# Patient Record
Sex: Female | Born: 1945 | Race: White | Hispanic: No | Marital: Married | Smoking: Current every day smoker
Health system: Southern US, Community
[De-identification: ages and names within clinical notes are randomized; demographics above are authoritative.]

## PROBLEM LIST (undated history)

## (undated) DIAGNOSIS — M810 Age-related osteoporosis without current pathological fracture: Secondary | ICD-10-CM

## (undated) DIAGNOSIS — K5790 Diverticulosis of intestine, part unspecified, without perforation or abscess without bleeding: Secondary | ICD-10-CM

## (undated) DIAGNOSIS — K5090 Crohn's disease, unspecified, without complications: Secondary | ICD-10-CM

## (undated) DIAGNOSIS — K219 Gastro-esophageal reflux disease without esophagitis: Secondary | ICD-10-CM

## (undated) DIAGNOSIS — I10 Essential (primary) hypertension: Secondary | ICD-10-CM

## (undated) DIAGNOSIS — E785 Hyperlipidemia, unspecified: Secondary | ICD-10-CM

## (undated) HISTORY — PX: NASAL SINUS SURGERY: SHX719

## (undated) HISTORY — PX: REPLACEMENT TOTAL KNEE: SUR1224

## (undated) HISTORY — PX: HAND SURGERY: SHX662

---

## 2009-10-03 ENCOUNTER — Emergency Department (HOSPITAL_COMMUNITY): Admission: EM | Admit: 2009-10-03 | Discharge: 2009-10-03 | Payer: Self-pay | Admitting: Emergency Medicine

## 2015-10-29 ENCOUNTER — Emergency Department (HOSPITAL_BASED_OUTPATIENT_CLINIC_OR_DEPARTMENT_OTHER)
Admission: EM | Admit: 2015-10-29 | Discharge: 2015-10-29 | Disposition: A | Discharge status: Home / Self Care | Payer: Medicare HMO | Attending: Emergency Medicine | Admitting: Emergency Medicine

## 2015-10-29 ENCOUNTER — Encounter (HOSPITAL_BASED_OUTPATIENT_CLINIC_OR_DEPARTMENT_OTHER): Payer: Self-pay | Admitting: *Deleted

## 2015-10-29 ENCOUNTER — Emergency Department (HOSPITAL_BASED_OUTPATIENT_CLINIC_OR_DEPARTMENT_OTHER): Payer: Medicare HMO

## 2015-10-29 DIAGNOSIS — I10 Essential (primary) hypertension: Secondary | ICD-10-CM | POA: Diagnosis not present

## 2015-10-29 DIAGNOSIS — E785 Hyperlipidemia, unspecified: Secondary | ICD-10-CM | POA: Diagnosis not present

## 2015-10-29 DIAGNOSIS — J40 Bronchitis, not specified as acute or chronic: Secondary | ICD-10-CM | POA: Diagnosis not present

## 2015-10-29 DIAGNOSIS — R112 Nausea with vomiting, unspecified: Secondary | ICD-10-CM | POA: Diagnosis present

## 2015-10-29 DIAGNOSIS — Z79899 Other long term (current) drug therapy: Secondary | ICD-10-CM | POA: Diagnosis not present

## 2015-10-29 DIAGNOSIS — F17200 Nicotine dependence, unspecified, uncomplicated: Secondary | ICD-10-CM | POA: Diagnosis not present

## 2015-10-29 DIAGNOSIS — R110 Nausea: Secondary | ICD-10-CM

## 2015-10-29 HISTORY — DX: Hyperlipidemia, unspecified: E78.5

## 2015-10-29 HISTORY — DX: Age-related osteoporosis without current pathological fracture: M81.0

## 2015-10-29 HISTORY — DX: Essential (primary) hypertension: I10

## 2015-10-29 HISTORY — DX: Diverticulosis of intestine, part unspecified, without perforation or abscess without bleeding: K57.90

## 2015-10-29 HISTORY — DX: Gastro-esophageal reflux disease without esophagitis: K21.9

## 2015-10-29 LAB — URINE MICROSCOPIC-ADD ON

## 2015-10-29 LAB — CBC WITH DIFFERENTIAL/PLATELET
Basophils Absolute: 0 10*3/uL (ref 0.0–0.1)
Basophils Relative: 0 %
Eosinophils Absolute: 0 10*3/uL (ref 0.0–0.7)
Eosinophils Relative: 0 %
HEMATOCRIT: 39 % (ref 36.0–46.0)
HEMOGLOBIN: 13.1 g/dL (ref 12.0–15.0)
LYMPHS ABS: 0.8 10*3/uL (ref 0.7–4.0)
LYMPHS PCT: 14 %
MCH: 31.9 pg (ref 26.0–34.0)
MCHC: 33.6 g/dL (ref 30.0–36.0)
MCV: 94.9 fL (ref 78.0–100.0)
MONOS PCT: 8 %
Monocytes Absolute: 0.5 10*3/uL (ref 0.1–1.0)
NEUTROS ABS: 4.6 10*3/uL (ref 1.7–7.7)
NEUTROS PCT: 78 %
Platelets: 198 10*3/uL (ref 150–400)
RBC: 4.11 MIL/uL (ref 3.87–5.11)
RDW: 13.7 % (ref 11.5–15.5)
WBC: 5.9 10*3/uL (ref 4.0–10.5)

## 2015-10-29 LAB — COMPREHENSIVE METABOLIC PANEL
ALT: 16 U/L (ref 14–54)
AST: 20 U/L (ref 15–41)
Albumin: 3.7 g/dL (ref 3.5–5.0)
Alkaline Phosphatase: 62 U/L (ref 38–126)
Anion gap: 5 (ref 5–15)
BILIRUBIN TOTAL: 1.2 mg/dL (ref 0.3–1.2)
BUN: 11 mg/dL (ref 6–20)
CALCIUM: 9.6 mg/dL (ref 8.9–10.3)
CO2: 26 mmol/L (ref 22–32)
Chloride: 104 mmol/L (ref 101–111)
Creatinine, Ser: 0.86 mg/dL (ref 0.44–1.00)
Glucose, Bld: 114 mg/dL — ABNORMAL HIGH (ref 65–99)
POTASSIUM: 3.5 mmol/L (ref 3.5–5.1)
Sodium: 135 mmol/L (ref 135–145)
Total Protein: 6.6 g/dL (ref 6.5–8.1)

## 2015-10-29 LAB — TROPONIN I: Troponin I: <0.03 ng/mL (ref <0.031)

## 2015-10-29 LAB — URINALYSIS, ROUTINE W REFLEX MICROSCOPIC
BILIRUBIN URINE: NEGATIVE
GLUCOSE, UA: NEGATIVE mg/dL
Ketones, ur: NEGATIVE mg/dL
Nitrite: NEGATIVE
PH: 6.5 (ref 5.0–8.0)
Protein, ur: NEGATIVE mg/dL
SPECIFIC GRAVITY, URINE: 1.008 (ref 1.005–1.030)

## 2015-10-29 MED ORDER — AZITHROMYCIN 250 MG PO TABS: 250.0000 mg | ORAL_TABLET | Freq: Every day | ORAL | Status: DC

## 2015-10-29 MED ORDER — SODIUM CHLORIDE 0.9 % IV BOLUS (SEPSIS)
500.0000 mL | Freq: Once | INTRAVENOUS | Status: AC
  Administered 2015-10-29: 500 mL via INTRAVENOUS

## 2015-10-29 MED ORDER — ONDANSETRON HCL 4 MG PO TABS: 4.0000 mg | ORAL_TABLET | Freq: Four times a day (QID) | ORAL | Status: DC

## 2015-10-29 MED ORDER — ONDANSETRON HCL 4 MG/2ML IJ SOLN
4.0000 mg | Freq: Once | INTRAMUSCULAR | Status: AC
  Administered 2015-10-29: 4 mg via INTRAVENOUS
  Filled 2015-10-29: qty 2

## 2015-10-29 NOTE — ED Notes (Signed)
Patient states that she recently took antibiotics for a sinus infection. She states she had chills last night, approx an hour ago she vomited several times, fatigue. Glucose was 123 ont the ambulance. 124/60

## 2015-10-29 NOTE — ED Notes (Addendum)
Given soda to drink and crackers.

## 2015-10-29 NOTE — Discharge Instructions (Signed)
Nausea and Vomiting °Nausea is a sick feeling that often comes before throwing up (vomiting). Vomiting is a reflex where stomach contents come out of your mouth. Vomiting can cause severe loss of body fluids (dehydration). Children and elderly adults can become dehydrated quickly, especially if they also have diarrhea. Nausea and vomiting are symptoms of a condition or disease. It is important to find the cause of your symptoms. °CAUSES  °· Direct irritation of the stomach lining. This irritation can result from increased acid production (gastroesophageal reflux disease), infection, food poisoning, taking certain medicines (such as nonsteroidal anti-inflammatory drugs), alcohol use, or tobacco use. °· Signals from the brain. These signals could be caused by a headache, heat exposure, an inner ear disturbance, increased pressure in the brain from injury, infection, a tumor, or a concussion, pain, emotional stimulus, or metabolic problems. °· An obstruction in the gastrointestinal tract (bowel obstruction). °· Illnesses such as diabetes, hepatitis, gallbladder problems, appendicitis, kidney problems, cancer, sepsis, atypical symptoms of a heart attack, or eating disorders. °· Medical treatments such as chemotherapy and radiation. °· Receiving medicine that makes you sleep (general anesthetic) during surgery. °DIAGNOSIS °Your caregiver may ask for tests to be done if the problems do not improve after a few days. Tests may also be done if symptoms are severe or if the reason for the nausea and vomiting is not clear. Tests may include: °· Urine tests. °· Blood tests. °· Stool tests. °· Cultures (to look for evidence of infection). °· X-rays or other imaging studies. °Test results can help your caregiver make decisions about treatment or the need for additional tests. °TREATMENT °You need to stay well hydrated. Drink frequently but in small amounts. You may wish to drink water, sports drinks, clear broth, or eat frozen  ice pops or gelatin dessert to help stay hydrated. When you eat, eating slowly may help prevent nausea. There are also some antinausea medicines that may help prevent nausea. °HOME CARE INSTRUCTIONS  °· Take all medicine as directed by your caregiver. °· If you do not have an appetite, do not force yourself to eat. However, you must continue to drink fluids. °· If you have an appetite, eat a normal diet unless your caregiver tells you differently. °¨ Eat a variety of complex carbohydrates (rice, wheat, potatoes, bread), lean meats, yogurt, fruits, and vegetables. °¨ Avoid high-fat foods because they are more difficult to digest. °· Drink enough water and fluids to keep your urine clear or pale yellow. °· If you are dehydrated, ask your caregiver for specific rehydration instructions. Signs of dehydration may include: °¨ Severe thirst. °¨ Dry lips and mouth. °¨ Dizziness. °¨ Dark urine. °¨ Decreasing urine frequency and amount. °¨ Confusion. °¨ Rapid breathing or pulse. °SEEK IMMEDIATE MEDICAL CARE IF:  °· You have blood or brown flecks (like coffee grounds) in your vomit. °· You have black or bloody stools. °· You have a severe headache or stiff neck. °· You are confused. °· You have severe abdominal pain. °· You have chest pain or trouble breathing. °· You do not urinate at least once every 8 hours. °· You develop cold or clammy skin. °· You continue to vomit for longer than 24 to 48 hours. °· You have a fever. °MAKE SURE YOU:  °· Understand these instructions. °· Will watch your condition. °· Will get help right away if you are not doing well or get worse. °  °This information is not intended to replace advice given to you by your health care provider. Make sure   you discuss any questions you have with your health care provider. °  °Document Released: 06/18/2005 Document Revised: 09/10/2011 Document Reviewed: 11/15/2010 °Elsevier Interactive Patient Education ©2016 [employer] ° °Upper Respiratory Infection,  Adult °Most upper respiratory infections (URIs) are a viral infection of the air passages leading to the lungs. A URI affects the nose, throat, and upper air passages. The most common type of URI is nasopharyngitis and is typically referred to as "the common cold." °URIs run their course and usually go away on their own. Most of the time, a URI does not require medical attention, but sometimes a bacterial infection in the upper airways can follow a viral infection. This is called a secondary infection. Sinus and middle ear infections are common types of secondary upper respiratory infections. °Bacterial pneumonia can also complicate a URI. A URI can worsen asthma and chronic obstructive pulmonary disease (COPD). Sometimes, these complications can require emergency medical care and may be life threatening.  °CAUSES °Almost all URIs are caused by viruses. A virus is a type of germ and can spread from one person to another.  °RISKS FACTORS °You may be at risk for a URI if:  °· You smoke.   °· You have chronic heart or lung disease. °· You have a weakened defense (immune) system.   °· You are very young or very old.   °· You have nasal allergies or asthma. °· You work in crowded or poorly ventilated areas. °· You work in health care facilities or schools. °SIGNS AND SYMPTOMS  °Symptoms typically develop 2-3 days after you come in contact with a cold virus. Most viral URIs last 7-10 days. However, viral URIs from the influenza virus (flu virus) can last 14-18 days and are typically more severe. Symptoms may include:  °· Runny or stuffy (congested) nose.   °· Sneezing.   °· Cough.   °· Sore throat.   °· Headache.   °· Fatigue.   °· Fever.   °· Loss of appetite.   °· Pain in your forehead, behind your eyes, and over your cheekbones (sinus pain). °· Muscle aches.   °DIAGNOSIS  °Your health care provider may diagnose a URI by: °· Physical exam. °· Tests to check that your symptoms are not due to another condition such  as: °¨ Strep throat. °¨ Sinusitis. °¨ Pneumonia. °¨ Asthma. °TREATMENT  °A URI goes away on its own with time. It cannot be cured with medicines, but medicines may be prescribed or recommended to relieve symptoms. Medicines may help: °· Reduce your fever. °· Reduce your cough. °· Relieve nasal congestion. °HOME CARE INSTRUCTIONS  °· Take medicines only as directed by your health care provider.   °· Gargle warm saltwater or take cough drops to comfort your throat as directed by your health care provider. °· Use a warm mist humidifier or inhale steam from a shower to increase air moisture. This may make it easier to breathe. °· Drink enough fluid to keep your urine clear or pale yellow.   °· Eat soups and other clear broths and maintain good nutrition.   °· Rest as needed.   °· Return to work when your temperature has returned to normal or as your health care provider advises. You may need to stay home longer to avoid infecting others. You can also use a face mask and careful hand washing to prevent spread of the virus. °· Increase the usage of your inhaler if you have asthma.   °· Do not use any tobacco products, including cigarettes, chewing tobacco, or electronic cigarettes. If you need help quitting, ask your   health care provider. °PREVENTION  °The best way to protect yourself from getting a cold is to practice good hygiene.  °· Avoid oral or hand contact with people with cold symptoms.   °· Wash your hands often if contact occurs.   °There is no clear evidence that vitamin C, vitamin E, echinacea, or exercise reduces the chance of developing a cold. However, it is always recommended to get plenty of rest, exercise, and practice good nutrition.  °SEEK MEDICAL CARE IF:  °· You are getting worse rather than better.   °· Your symptoms are not controlled by medicine.   °· You have chills. °· You have worsening shortness of breath. °· You have brown or red mucus. °· You have yellow or brown nasal discharge. °· You have  pain in your face, especially when you bend forward. °· You have a fever. °· You have swollen neck glands. °· You have pain while swallowing. °· You have white areas in the back of your throat. °SEEK IMMEDIATE MEDICAL CARE IF:  °· You have severe or persistent: °¨ Headache. °¨ Ear pain. °¨ Sinus pain. °¨ Chest pain. °· You have chronic lung disease and any of the following: °¨ Wheezing. °¨ Prolonged cough. °¨ Coughing up blood. °¨ A change in your usual mucus. °· You have a stiff neck. °· You have changes in your: °¨ Vision. °¨ Hearing. °¨ Thinking. °¨ Mood. °MAKE SURE YOU:  °· Understand these instructions. °· Will watch your condition. °· Will get help right away if you are not doing well or get worse. °  °This information is not intended to replace advice given to you by your health care provider. Make sure you discuss any questions you have with your health care provider. °  °Document Released: 12/12/2000 Document Revised: 11/02/2014 Document Reviewed: 09/23/2013 °Elsevier Interactive Patient Education ©2016 [employer] ° °

## 2015-10-29 NOTE — ED Provider Notes (Signed)
CSN: [id number]     Arrival date & time 10/29/15  1850 History  By signing my name below, I, Bethel Born, attest that this documentation has been prepared under the direction and in the presence of Tilden Fossa, MD. Electronically Signed: Bethel Born, ED Scribe. 10/29/2015. 7:41 PM    Chief Complaint  Patient presents with  . Nausea   The history is provided by the patient. No language interpreter was used.   Sheila Davenport is a 70 y.o. female who presents to the Emergency Department complaining of nausea and vomiting with onset this afternoon. She had 1 episode of emesis "that was in several stages". Pt states that she has had a sinus infection since last month and was put on cefdinir without relief. Associated symptoms include yellow nasal discharge and a cough productive of yellow sputum. Last night she had an episode of chills and was shaking uncontrollably. Her temperature was 100.7 at that time and she treated it with Tylenol. Since than she has had fatigue.  Pt denies chest pain, abdominal pain, diarrhea, dysuria. Pt notes being under significant stress since her daughter dies of breast cancer 25 days ago.   Past Medical History  Diagnosis Date  . Hypertension   . Diverticulosis   . Osteoporosis   . GERD (gastroesophageal reflux disease)   . Dyslipidemia    Past Surgical History  Procedure Laterality Date  . Replacement total knee Right   . Nasal sinus surgery    . Hand surgery Right    No family history on file. Social History  Substance Use Topics  . Smoking status: Current Every Day Smoker  . Smokeless tobacco: None  . Alcohol Use: No   OB History    No data available     Review of Systems  Constitutional: Positive for fever, chills and fatigue.  HENT: Positive for congestion and rhinorrhea.   Respiratory: Positive for cough.   Cardiovascular: Negative for chest pain.  Gastrointestinal: Positive for nausea and vomiting. Negative for abdominal pain and  diarrhea.  Genitourinary: Negative for dysuria.  All other systems reviewed and are negative.  Allergies  Decongest-aid; Levofloxacin; Nitrofuran derivatives; and Phenazopyridine  Home Medications   Prior to Admission medications   Medication Sig Start Date End Date Taking? Authorizing Provider  aspirin 81 MG tablet Take 81 mg by mouth daily.   Yes Historical Provider, MD  atenolol (TENORMIN) 50 MG tablet Take 50 mg by mouth daily.   Yes Historical Provider, MD  BIOGAIA PROBIOTIC (BIOGAIA PROBIOTIC) LIQD Take by mouth daily at 8 pm.   Yes Historical Provider, MD  calcium-vitamin D (OSCAL WITH D) 500-200 MG-UNIT tablet Take 1 tablet by mouth.   Yes Historical Provider, MD  cholecalciferol (VITAMIN D) 400 units TABS tablet Take 800 Units by mouth.   Yes Historical Provider, MD  dicyclomine (BENTYL) 20 MG tablet Take 20 mg by mouth every 6 (six) hours.   Yes Historical Provider, MD  meclizine (ANTIVERT) 25 MG tablet Take 25 mg by mouth 3 (three) times daily as needed for dizziness.   Yes Historical Provider, MD  Multiple Vitamins-Minerals (MULTIVITAMIN WITH MINERALS) tablet Take 1 tablet by mouth daily.   Yes Historical Provider, MD  sertraline (ZOLOFT) 50 MG tablet Take 50 mg by mouth daily.   Yes Historical Provider, MD  valsartan-hydrochlorothiazide (DIOVAN-HCT) 160-12.5 MG tablet Take 1 tablet by mouth daily.   Yes Historical Provider, MD  azithromycin (ZITHROMAX) 250 MG tablet Take 1 tablet (250 mg total) by mouth daily.  Take first 2 tablets together, then 1 every day until finished. 10/29/15   Tilden Fossa, MD  ondansetron (ZOFRAN) 4 MG tablet Take 1 tablet (4 mg total) by mouth every 6 (six) hours. 10/29/15   Tilden Fossa, MD   BP 121/59 mmHg  Pulse 67  Temp(Src) 99 F (37.2 C) (Oral)  Resp 19  Ht 5\' 2"  (1.575 m)  Wt 146 lb (66.225 kg)  BMI 26.70 kg/m2  SpO2 97% Physical Exam  Constitutional: She is oriented to person, place, and time. She appears well-developed and  well-nourished.  HENT:  Head: Normocephalic and atraumatic.  Cardiovascular: Normal rate and regular rhythm.   No murmur heard. Pulmonary/Chest: Effort normal and breath sounds normal. No respiratory distress.  Abdominal: Soft. There is no tenderness. There is no rebound and no guarding.  Musculoskeletal: She exhibits no edema or tenderness.  Neurological: She is alert and oriented to person, place, and time.  Skin: Skin is warm and dry.  Psychiatric: She has a normal mood and affect. Her behavior is normal.  Nursing note and vitals reviewed.   ED Course  Procedures (including critical care time) DIAGNOSTIC STUDIES: Oxygen Saturation is 97% on RA,  normal by my interpretation.    COORDINATION OF CARE: 7:37 PM Discussed treatment plan which includes lab work, CXR, and EKG with pt at bedside and pt agreed to plan.  Labs Review Labs Reviewed  COMPREHENSIVE METABOLIC PANEL - Abnormal; Notable for the following:    Glucose, Bld 114 (*)    All other components within normal limits  URINALYSIS, ROUTINE W REFLEX MICROSCOPIC (NOT AT [facility]) - Abnormal; Notable for the following:    Hgb urine dipstick TRACE (*)    Leukocytes, UA TRACE (*)    All other components within normal limits  URINE MICROSCOPIC-ADD ON - Abnormal; Notable for the following:    Squamous Epithelial / LPF 0-5 (*)    Bacteria, UA RARE (*)    All other components within normal limits  TROPONIN I  CBC WITH DIFFERENTIAL/PLATELET    Imaging Review Dg Chest 2 View  10/29/2015  CLINICAL DATA:  Chronic productive cough, generalized weakness and intermittent fever. Acute onset of vomiting. Initial encounter. EXAM: CHEST  2 VIEW COMPARISON:  Chest radiograph from 10/13/2015 FINDINGS: The lungs are well-aerated. Mild peribronchial thickening is noted. There is no evidence of focal opacification, pleural effusion or pneumothorax. The heart is normal in size; the mediastinal contour is within normal limits. No acute osseous  abnormalities are seen. There is chronic deformity of the right lateral seventh rib. Clips are noted within the right upper quadrant, reflecting prior cholecystectomy. IMPRESSION: Mild peribronchial thickening noted.  Lungs otherwise clear. Electronically Signed   By: Roanna Raider M.D.   On: 10/29/2015 20:07   I have personally reviewed and evaluated these images and lab results as part of my medical decision-making.   EKG Interpretation   Date/Time:  Saturday October 29 2015 20:02:08 EDT Ventricular Rate:  67 PR Interval:  196 QRS Duration: 100 QT Interval:  385 QTC Calculation: 406 R Axis:   -11 Text Interpretation:  Sinus rhythm Low voltage, precordial leads Confirmed  by Lincoln Brigham [phone]) on 10/29/2015 8:18:59 PM      MDM   Final diagnoses:  Nausea in adult  Bronchitis   Patient here for evaluation of cough, nausea, vomiting. She is nontoxic appearing on examination with no respiratory distress. No evidence of pneumonia, UTI. Presentation is not consistent with ACS, PE. Discussed home care for her  bronchitis and nausea, outpatient follow-up, return precautions.  I personally performed the services described in this documentation, which was scribed in my presence. The recorded information has been reviewed and is accurate.    Tilden Fossa, MD 10/30/15 0003

## 2015-10-29 NOTE — ED Notes (Signed)
Delay in taking EKG due to pt going to x-ray

## 2016-05-21 ENCOUNTER — Encounter (HOSPITAL_BASED_OUTPATIENT_CLINIC_OR_DEPARTMENT_OTHER): Payer: Self-pay | Admitting: *Deleted

## 2016-05-21 ENCOUNTER — Emergency Department (HOSPITAL_BASED_OUTPATIENT_CLINIC_OR_DEPARTMENT_OTHER)
Admission: EM | Admit: 2016-05-21 | Discharge: 2016-05-21 | Disposition: A | Discharge status: Home / Self Care | Payer: Medicare HMO | Attending: Emergency Medicine | Admitting: Emergency Medicine

## 2016-05-21 DIAGNOSIS — R112 Nausea with vomiting, unspecified: Secondary | ICD-10-CM

## 2016-05-21 DIAGNOSIS — Z7982 Long term (current) use of aspirin: Secondary | ICD-10-CM | POA: Diagnosis not present

## 2016-05-21 DIAGNOSIS — I10 Essential (primary) hypertension: Secondary | ICD-10-CM | POA: Diagnosis not present

## 2016-05-21 DIAGNOSIS — F17200 Nicotine dependence, unspecified, uncomplicated: Secondary | ICD-10-CM | POA: Insufficient documentation

## 2016-05-21 DIAGNOSIS — Z96651 Presence of right artificial knee joint: Secondary | ICD-10-CM | POA: Diagnosis not present

## 2016-05-21 LAB — URINALYSIS, ROUTINE W REFLEX MICROSCOPIC
Bilirubin Urine: NEGATIVE
Glucose, UA: NEGATIVE mg/dL
KETONES UR: NEGATIVE mg/dL
Nitrite: NEGATIVE
PROTEIN: NEGATIVE mg/dL
Specific Gravity, Urine: 1.014 (ref 1.005–1.030)
pH: 7 (ref 5.0–8.0)

## 2016-05-21 LAB — URINE MICROSCOPIC-ADD ON

## 2016-05-21 LAB — CBC WITH DIFFERENTIAL/PLATELET
BASOS ABS: 0 10*3/uL (ref 0.0–0.1)
BASOS PCT: 0 %
EOS ABS: 0 10*3/uL (ref 0.0–0.7)
EOS PCT: 0 %
HCT: 38.9 % (ref 36.0–46.0)
Hemoglobin: 12.7 g/dL (ref 12.0–15.0)
LYMPHS PCT: 9 %
Lymphs Abs: 0.7 10*3/uL (ref 0.7–4.0)
MCH: 31.2 pg (ref 26.0–34.0)
MCHC: 32.6 g/dL (ref 30.0–36.0)
MCV: 95.6 fL (ref 78.0–100.0)
Monocytes Absolute: 0.2 10*3/uL (ref 0.1–1.0)
Monocytes Relative: 3 %
Neutro Abs: 6.6 10*3/uL (ref 1.7–7.7)
Neutrophils Relative %: 88 %
PLATELETS: 234 10*3/uL (ref 150–400)
RBC: 4.07 MIL/uL (ref 3.87–5.11)
RDW: 13.1 % (ref 11.5–15.5)
WBC: 7.5 10*3/uL (ref 4.0–10.5)

## 2016-05-21 LAB — BASIC METABOLIC PANEL
ANION GAP: 10 (ref 5–15)
BUN: 16 mg/dL (ref 6–20)
CO2: 23 mmol/L (ref 22–32)
Calcium: 10 mg/dL (ref 8.9–10.3)
Chloride: 103 mmol/L (ref 101–111)
Creatinine, Ser: 1.12 mg/dL — ABNORMAL HIGH (ref 0.44–1.00)
GFR, EST AFRICAN AMERICAN: 56 mL/min — AB (ref >60)
GFR, EST NON AFRICAN AMERICAN: 48 mL/min — AB (ref >60)
Glucose, Bld: 138 mg/dL — ABNORMAL HIGH (ref 65–99)
POTASSIUM: 3.6 mmol/L (ref 3.5–5.1)
SODIUM: 136 mmol/L (ref 135–145)

## 2016-05-21 LAB — I-STAT CG4 LACTIC ACID, ED
Lactic Acid, Venous: 1.01 mmol/L (ref 0.5–1.9)
Lactic Acid, Venous: 0.37 mmol/L — ABNORMAL LOW (ref 0.5–1.9)

## 2016-05-21 MED ORDER — ACETAMINOPHEN 500 MG PO TABS
1000.0000 mg | ORAL_TABLET | Freq: Once | ORAL | Status: AC
  Administered 2016-05-21: 1000 mg via ORAL
  Filled 2016-05-21: qty 2

## 2016-05-21 MED ORDER — ONDANSETRON 8 MG PO TBDP: 8.0000 mg | ORAL_TABLET | Freq: Three times a day (TID) | ORAL | 0 refills | Status: DC | PRN

## 2016-05-21 MED ORDER — SODIUM CHLORIDE 0.9 % IV BOLUS (SEPSIS)
1000.0000 mL | Freq: Once | INTRAVENOUS | Status: AC
  Administered 2016-05-21: 1000 mL via INTRAVENOUS

## 2016-05-21 MED ORDER — ONDANSETRON HCL 4 MG/2ML IJ SOLN: 4.0000 mg | Freq: Once | INTRAMUSCULAR | Status: DC

## 2016-05-21 MED ORDER — ONDANSETRON HCL 4 MG/2ML IJ SOLN
4.0000 mg | Freq: Once | INTRAMUSCULAR | Status: AC
  Administered 2016-05-21: 4 mg via INTRAVENOUS
  Filled 2016-05-21: qty 2

## 2016-05-21 NOTE — ED Triage Notes (Addendum)
Pt states that around 2030 pt became nauseated with vomiting and chills. Has vomited approx 3 times since then. Pt states that has recently been treated for a UTI and completed the course of antibiotics on Tuesday. This was her second course of antibiotics.  Denies any urinary symptoms.

## 2016-05-21 NOTE — ED Provider Notes (Addendum)
[facility] Provider Note: [name], MD, FACEP  CSN: [id number] MRN: [id number] ARRIVAL: 05/21/16 at 0141 ROOM: MH03/[facility]03   CHIEF COMPLAINT  Vomiting   HISTORY OF PRESENT ILLNESS  Sheila Davenport is a 70 y.o. female who was recently treated for a urinary tract infection caused by Enterococcus faecalis. She completed her course of antibiotics 6 days ago and her urinary symptoms have resolved.  She is here with nausea and vomiting that began about 8:30 PM. She has had a total of 3 episodes of vomiting since then. This has been accompanied by fever as high as 102.6, chills, generalized weakness, general malaise and headache. She denies abdominal pain or diarrhea. She has had a chronic cough since March that is improving.   Past Medical History:  Diagnosis Date  . Diverticulosis   . Dyslipidemia   . GERD (gastroesophageal reflux disease)   . Hypertension   . Osteoporosis     Past Surgical History:  Procedure Laterality Date  . HAND SURGERY Right   . NASAL SINUS SURGERY    . REPLACEMENT TOTAL KNEE Right     No family history on file.  Social History  Substance Use Topics  . Smoking status: Current Every Day Smoker  . Smokeless tobacco: Never Used  . Alcohol use No    Prior to Admission medications   Medication Sig Start Date End Date Taking? Authorizing Provider  aspirin 81 MG tablet Take 81 mg by mouth daily.    Historical Provider, MD  atenolol (TENORMIN) 50 MG tablet Take 50 mg by mouth daily.    Historical Provider, MD  azithromycin (ZITHROMAX) 250 MG tablet Take 1 tablet (250 mg total) by mouth daily. Take first 2 tablets together, then 1 every day until finished. 10/29/15   Tilden Fossa, MD  BIOGAIA PROBIOTIC (BIOGAIA PROBIOTIC) LIQD Take by mouth daily at 8 pm.    Historical Provider, MD  calcium-vitamin D (OSCAL WITH D) 500-200 MG-UNIT tablet Take 1 tablet by mouth.    Historical Provider, MD  cholecalciferol (VITAMIN D) 400 units TABS tablet  Take 800 Units by mouth.    Historical Provider, MD  meclizine (ANTIVERT) 25 MG tablet Take 25 mg by mouth 3 (three) times daily as needed for dizziness.    Historical Provider, MD  Multiple Vitamins-Minerals (MULTIVITAMIN WITH MINERALS) tablet Take 1 tablet by mouth daily.    Historical Provider, MD  ondansetron (ZOFRAN ODT) 8 MG disintegrating tablet Take 1 tablet (8 mg total) by mouth every 8 (eight) hours as needed for nausea or vomiting. 05/21/16   Lynnea Vandervoort, MD  ondansetron (ZOFRAN) 4 MG tablet Take 1 tablet (4 mg total) by mouth every 6 (six) hours. 10/29/15   Tilden Fossa, MD  sertraline (ZOLOFT) 50 MG tablet Take 50 mg by mouth daily.    Historical Provider, MD  valsartan-hydrochlorothiazide (DIOVAN-HCT) 160-12.5 MG tablet Take 1 tablet by mouth daily.    Historical Provider, MD    Allergies Decongest-aid [pseudoephedrine]; Levofloxacin; Nitrofuran derivatives; and Phenazopyridine   REVIEW OF SYSTEMS  Negative except as noted here or in the History of Present Illness.   PHYSICAL EXAMINATION  Initial Vital Signs Blood pressure 120/64, pulse 80, temperature 102.6 F (39.2 C), temperature source Oral, resp. rate 22, SpO2 95 %.  Examination General: Well-developed, well-nourished female in no acute distress; appearance consistent with age of record HENT: normocephalic; atraumatic Eyes: pupils equal, round and reactive to light; extraocular muscles intact Neck: supple Heart: regular rate and rhythm Lungs: clear to  auscultation bilaterally Abdomen: soft; nondistended; nontender; no masses or hepatosplenomegaly; bowel sounds present Extremities: No deformity; full range of motion; pulses normal Neurologic: Awake, alert and oriented; motor function intact in all extremities and symmetric; no facial droop Skin: Warm and dry Psychiatric: Flat affect   RESULTS  Summary of this visit's results, reviewed by myself:   EKG Interpretation  Date/Time:    Ventricular Rate:    PR  Interval:    QRS Duration:   QT Interval:    QTC Calculation:   R Axis:     Text Interpretation:        Laboratory Studies: Results for orders placed or performed during the hospital encounter of 05/21/16 (from the past 24 hour(s))  CBC with Differential/Platelet     Status: None   Collection Time: 05/21/16  1:55 AM  Result Value Ref Range   WBC 7.5 4.0 - 10.5 K/uL   RBC 4.07 3.87 - 5.11 MIL/uL   Hemoglobin 12.7 12.0 - 15.0 g/dL   HCT 78.2 42.3 - 53.6 %   MCV 95.6 78.0 - 100.0 fL   MCH 31.2 26.0 - 34.0 pg   MCHC 32.6 30.0 - 36.0 g/dL   RDW 14.4 31.5 - 40.0 %   Platelets 234 150 - 400 K/uL   Neutrophils Relative % 88 %   Neutro Abs 6.6 1.7 - 7.7 K/uL   Lymphocytes Relative 9 %   Lymphs Abs 0.7 0.7 - 4.0 K/uL   Monocytes Relative 3 %   Monocytes Absolute 0.2 0.1 - 1.0 K/uL   Eosinophils Relative 0 %   Eosinophils Absolute 0.0 0.0 - 0.7 K/uL   Basophils Relative 0 %   Basophils Absolute 0.0 0.0 - 0.1 K/uL  Basic metabolic panel     Status: Abnormal   Collection Time: 05/21/16  1:55 AM  Result Value Ref Range   Sodium 136 135 - 145 mmol/L   Potassium 3.6 3.5 - 5.1 mmol/L   Chloride 103 101 - 111 mmol/L   CO2 23 22 - 32 mmol/L   Glucose, Bld 138 (H) 65 - 99 mg/dL   BUN 16 6 - 20 mg/dL   Creatinine, Ser 8.67 (H) 0.44 - 1.00 mg/dL   Calcium 61.9 8.9 - 50.9 mg/dL   GFR calc non Af Amer 48 (L) >60 mL/min   GFR calc Af Amer 56 (L) >60 mL/min   Anion gap 10 5 - 15  I-Stat CG4 Lactic Acid, ED     Status: None   Collection Time: 05/21/16  2:14 AM  Result Value Ref Range   Lactic Acid, Venous 1.01 0.5 - 1.9 mmol/L  Urinalysis, Routine w reflex microscopic (not at [facility])     Status: Abnormal   Collection Time: 05/21/16  4:02 AM  Result Value Ref Range   Color, Urine YELLOW YELLOW   APPearance CLOUDY (A) CLEAR   Specific Gravity, Urine 1.014 1.005 - 1.030   pH 7.0 5.0 - 8.0   Glucose, UA NEGATIVE NEGATIVE mg/dL   Hgb urine dipstick SMALL (A) NEGATIVE   Bilirubin Urine  NEGATIVE NEGATIVE   Ketones, ur NEGATIVE NEGATIVE mg/dL   Protein, ur NEGATIVE NEGATIVE mg/dL   Nitrite NEGATIVE NEGATIVE   Leukocytes, UA TRACE (A) NEGATIVE  Urine microscopic-add on     Status: Abnormal   Collection Time: 05/21/16  4:02 AM  Result Value Ref Range   Squamous Epithelial / LPF 0-5 (A) NONE SEEN   WBC, UA 0-5 0 - 5 WBC/hpf   RBC /  HPF 0-5 0 - 5 RBC/hpf   Bacteria, UA FEW (A) NONE SEEN  I-Stat CG4 Lactic Acid, ED     Status: Abnormal   Collection Time: 05/21/16  6:51 AM  Result Value Ref Range   Lactic Acid, Venous 0.37 (L) 0.5 - 1.9 mmol/L   Imaging Studies: No results found.  ED COURSE  Nursing notes and initial vitals signs, including pulse oximetry, reviewed.  Vitals:   05/21/16 0337 05/21/16 0436 05/21/16 0445 05/21/16 0640  BP:  (!) 96/52 (!) 96/52 (!) 97/51  Pulse:  69 69 62  Resp:  20    Temp: 100.9 F (38.3 C) 99.3 F (37.4 C)    TempSrc: Oral Oral    SpO2:  96% 90% 95%   5:20 AM Patient feeling better after IV fluids and Zofran. She has been able to drink fluids without emesis. Her abdomen remains soft and nontender and without complaint of pain. This likely represents an acute viral illness. She was advised to return for worsening symptoms.Patient noted to be hypotensive with a systolic blood pressure in the 90s. She states this is an ongoing problem related to her antihypertensive medication. We will give a second liter bolus and reassess.   6:53 AM SBP remains in high 90s. The repeat lactate is 0.37.  PROCEDURES    ED DIAGNOSES     ICD-9-CM ICD-10-CM   1. Nausea and vomiting in adult 787.01 R11.2        Paula LibraJohn Chantil Bari, MD 05/21/16 Kem [id number]    Govind Furey, MD 05/21/16 [phone]

## 2016-05-26 LAB — CULTURE, BLOOD (ROUTINE X 2)
Culture: NO GROWTH
Culture: NO GROWTH

## 2016-08-19 ENCOUNTER — Encounter (HOSPITAL_BASED_OUTPATIENT_CLINIC_OR_DEPARTMENT_OTHER): Payer: Self-pay | Admitting: *Deleted

## 2016-08-19 ENCOUNTER — Emergency Department (HOSPITAL_BASED_OUTPATIENT_CLINIC_OR_DEPARTMENT_OTHER): Payer: Medicare Other

## 2016-08-19 ENCOUNTER — Inpatient Hospital Stay (HOSPITAL_BASED_OUTPATIENT_CLINIC_OR_DEPARTMENT_OTHER)
Admission: EM | Admit: 2016-08-19 | Discharge: 2016-08-23 | DRG: 392 | Disposition: A | Discharge status: Home / Self Care | Payer: Medicare Other | Attending: Internal Medicine | Admitting: Internal Medicine

## 2016-08-19 DIAGNOSIS — R000 Tachycardia, unspecified: Secondary | ICD-10-CM | POA: Diagnosis not present

## 2016-08-19 DIAGNOSIS — K219 Gastro-esophageal reflux disease without esophagitis: Secondary | ICD-10-CM | POA: Diagnosis present

## 2016-08-19 DIAGNOSIS — M810 Age-related osteoporosis without current pathological fracture: Secondary | ICD-10-CM | POA: Diagnosis not present

## 2016-08-19 DIAGNOSIS — Z7982 Long term (current) use of aspirin: Secondary | ICD-10-CM

## 2016-08-19 DIAGNOSIS — Z79899 Other long term (current) drug therapy: Secondary | ICD-10-CM | POA: Diagnosis not present

## 2016-08-19 DIAGNOSIS — F17200 Nicotine dependence, unspecified, uncomplicated: Secondary | ICD-10-CM | POA: Diagnosis present

## 2016-08-19 DIAGNOSIS — Z8249 Family history of ischemic heart disease and other diseases of the circulatory system: Secondary | ICD-10-CM | POA: Diagnosis not present

## 2016-08-19 DIAGNOSIS — R17 Unspecified jaundice: Secondary | ICD-10-CM | POA: Diagnosis present

## 2016-08-19 DIAGNOSIS — I10 Essential (primary) hypertension: Secondary | ICD-10-CM | POA: Diagnosis present

## 2016-08-19 DIAGNOSIS — Z881 Allergy status to other antibiotic agents status: Secondary | ICD-10-CM

## 2016-08-19 DIAGNOSIS — J111 Influenza due to unidentified influenza virus with other respiratory manifestations: Secondary | ICD-10-CM | POA: Diagnosis present

## 2016-08-19 DIAGNOSIS — G4700 Insomnia, unspecified: Secondary | ICD-10-CM | POA: Diagnosis present

## 2016-08-19 DIAGNOSIS — R6510 Systemic inflammatory response syndrome (SIRS) of non-infectious origin without acute organ dysfunction: Secondary | ICD-10-CM | POA: Diagnosis present

## 2016-08-19 DIAGNOSIS — E876 Hypokalemia: Secondary | ICD-10-CM | POA: Diagnosis not present

## 2016-08-19 DIAGNOSIS — Z888 Allergy status to other drugs, medicaments and biological substances status: Secondary | ICD-10-CM

## 2016-08-19 DIAGNOSIS — R740 Nonspecific elevation of levels of transaminase and lactic acid dehydrogenase [LDH]: Secondary | ICD-10-CM | POA: Diagnosis present

## 2016-08-19 DIAGNOSIS — Z9889 Other specified postprocedural states: Secondary | ICD-10-CM | POA: Diagnosis not present

## 2016-08-19 DIAGNOSIS — K5732 Diverticulitis of large intestine without perforation or abscess without bleeding: Principal | ICD-10-CM | POA: Diagnosis present

## 2016-08-19 DIAGNOSIS — K5792 Diverticulitis of intestine, part unspecified, without perforation or abscess without bleeding: Secondary | ICD-10-CM | POA: Diagnosis present

## 2016-08-19 DIAGNOSIS — R509 Fever, unspecified: Secondary | ICD-10-CM | POA: Diagnosis present

## 2016-08-19 LAB — URINALYSIS, ROUTINE W REFLEX MICROSCOPIC
Bilirubin Urine: NEGATIVE
Glucose, UA: NEGATIVE mg/dL
Hgb urine dipstick: NEGATIVE
Ketones, ur: NEGATIVE mg/dL
LEUKOCYTES UA: NEGATIVE
NITRITE: NEGATIVE
Protein, ur: NEGATIVE mg/dL
SPECIFIC GRAVITY, URINE: 1.025 (ref 1.005–1.030)
pH: 7 (ref 5.0–8.0)

## 2016-08-19 LAB — COMPREHENSIVE METABOLIC PANEL
ALK PHOS: 64 U/L (ref 38–126)
ALT: 15 U/L (ref 14–54)
ANION GAP: 7 (ref 5–15)
AST: 23 U/L (ref 15–41)
Albumin: 3.5 g/dL (ref 3.5–5.0)
BUN: 17 mg/dL (ref 6–20)
CALCIUM: 10.2 mg/dL (ref 8.9–10.3)
CO2: 23 mmol/L (ref 22–32)
Chloride: 107 mmol/L (ref 101–111)
Creatinine, Ser: 0.88 mg/dL (ref 0.44–1.00)
GFR calc non Af Amer: >60 mL/min (ref >60)
Glucose, Bld: 102 mg/dL — ABNORMAL HIGH (ref 65–99)
Potassium: 3.1 mmol/L — ABNORMAL LOW (ref 3.5–5.1)
SODIUM: 137 mmol/L (ref 135–145)
Total Bilirubin: 0.6 mg/dL (ref 0.3–1.2)
Total Protein: 6.1 g/dL — ABNORMAL LOW (ref 6.5–8.1)

## 2016-08-19 LAB — CBC WITH DIFFERENTIAL/PLATELET
BASOS ABS: 0 10*3/uL (ref 0.0–0.1)
BASOS PCT: 0 %
Eosinophils Absolute: 0 10*3/uL (ref 0.0–0.7)
Eosinophils Relative: 0 %
HEMATOCRIT: 37.4 % (ref 36.0–46.0)
HEMOGLOBIN: 12.1 g/dL (ref 12.0–15.0)
Lymphocytes Relative: 8 %
Lymphs Abs: 0.7 10*3/uL (ref 0.7–4.0)
MCH: 30.2 pg (ref 26.0–34.0)
MCHC: 32.4 g/dL (ref 30.0–36.0)
MCV: 93.3 fL (ref 78.0–100.0)
MONOS PCT: 2 %
Monocytes Absolute: 0.1 10*3/uL (ref 0.1–1.0)
NEUTROS ABS: 7.9 10*3/uL — AB (ref 1.7–7.7)
NEUTROS PCT: 90 %
Platelets: 200 10*3/uL (ref 150–400)
RBC: 4.01 MIL/uL (ref 3.87–5.11)
RDW: 13.5 % (ref 11.5–15.5)
WBC: 8.8 10*3/uL (ref 4.0–10.5)

## 2016-08-19 LAB — LIPASE, BLOOD: Lipase: 24 U/L (ref 11–51)

## 2016-08-19 MED ORDER — SODIUM CHLORIDE 0.9 % IV BOLUS (SEPSIS)
1000.0000 mL | Freq: Once | INTRAVENOUS | Status: AC
Start: 2016-08-19 — End: 2016-08-19
  Administered 2016-08-19: 1000 mL via INTRAVENOUS

## 2016-08-19 MED ORDER — IBUPROFEN 400 MG PO TABS
600.0000 mg | ORAL_TABLET | Freq: Once | ORAL | Status: AC
  Administered 2016-08-19: 600 mg via ORAL
  Filled 2016-08-19: qty 1

## 2016-08-19 MED ORDER — ONDANSETRON HCL 4 MG/2ML IJ SOLN
4.0000 mg | Freq: Once | INTRAMUSCULAR | Status: AC
  Administered 2016-08-19: 4 mg via INTRAVENOUS
  Filled 2016-08-19: qty 2

## 2016-08-19 MED ORDER — PIPERACILLIN-TAZOBACTAM 3.375 G IVPB
3.3750 g | Freq: Three times a day (TID) | INTRAVENOUS | Status: DC
  Administered 2016-08-19 – 2016-08-22 (×8): 3.375 g via INTRAVENOUS
  Filled 2016-08-19 (×9): qty 50

## 2016-08-19 MED ORDER — IOPAMIDOL (ISOVUE-300) INJECTION 61%
100.0000 mL | Freq: Once | INTRAVENOUS | Status: AC | PRN
  Administered 2016-08-19: 100 mL via INTRAVENOUS

## 2016-08-19 NOTE — ED Provider Notes (Signed)
[facility] Provider Note   CSN: [id number] Arrival date & time: 08/19/16  1701   By signing my name below, I, Sheila Davenport, attest that this documentation has been prepared under the direction and in the presence of Blong Busk Lyn Marcques Wrightsman, MD. Electronically Signed: Soijett Davenport, ED Scribe. 08/19/16. 6:24 PM.  History   Chief Complaint Chief Complaint  Patient presents with  . Influenza    HPI Sheila Davenport is a 71 y.o. female with a PMHx of HTN, who presents to the Emergency Department brought in by EMS complaining of sudden onset flu-like symptoms onset earlier today PTA. Pt reports associated fever, generalized body aches, nausea, and abdominal pain. Pt has tried 1000 mg tylenol with her last dose being at 4:15 PM with mild relief of her symptoms. Pt denies chills, vomiting, diarrhea, and any other symptoms.     The history is provided by the patient. No language interpreter was used.    Past Medical History:  Diagnosis Date  . Diverticulosis   . Dyslipidemia   . GERD (gastroesophageal reflux disease)   . Hypertension   . Osteoporosis     Patient Active Problem List   Diagnosis Date Noted  . Fever 08/19/2016    Past Surgical History:  Procedure Laterality Date  . HAND SURGERY Right   . NASAL SINUS SURGERY    . REPLACEMENT TOTAL KNEE Right     OB History    No data available       Home Medications    Prior to Admission medications   Medication Sig Start Date End Date Taking? Authorizing Provider  AMLODIPINE BESY-BENAZEPRIL HCL PO Take by mouth.   Yes Historical Provider, MD  aspirin 81 MG tablet Take 81 mg by mouth daily.   Yes Historical Provider, MD  atenolol (TENORMIN) 50 MG tablet Take 50 mg by mouth daily.   Yes Historical Provider, MD  BIOGAIA PROBIOTIC (BIOGAIA PROBIOTIC) LIQD Take by mouth daily at 8 pm.   Yes Historical Provider, MD  calcium-vitamin D (OSCAL WITH D) 500-200 MG-UNIT tablet Take 1 tablet by mouth.   Yes Historical  Provider, MD  cholecalciferol (VITAMIN D) 400 units TABS tablet Take 800 Units by mouth.   Yes Historical Provider, MD  Cranberry 400 MG CAPS Take by mouth.   Yes Historical Provider, MD  meclizine (ANTIVERT) 25 MG tablet Take 25 mg by mouth 3 (three) times daily as needed for dizziness.   Yes Historical Provider, MD  Multiple Vitamins-Minerals (MULTIVITAMIN WITH MINERALS) tablet Take 1 tablet by mouth daily.   Yes Historical Provider, MD  ranitidine (ZANTAC) 150 MG tablet Take 150 mg by mouth 2 (two) times daily.   Yes Historical Provider, MD  sertraline (ZOLOFT) 50 MG tablet Take 50 mg by mouth daily.   Yes Historical Provider, MD  valsartan (DIOVAN) 160 MG tablet Take 160 mg by mouth daily.   Yes Historical Provider, MD  azithromycin (ZITHROMAX) 250 MG tablet Take 1 tablet (250 mg total) by mouth daily. Take first 2 tablets together, then 1 every day until finished. 10/29/15   Tilden Fossa, MD  ondansetron (ZOFRAN ODT) 8 MG disintegrating tablet Take 1 tablet (8 mg total) by mouth every 8 (eight) hours as needed for nausea or vomiting. 05/21/16   John Molpus, MD  ondansetron (ZOFRAN) 4 MG tablet Take 1 tablet (4 mg total) by mouth every 6 (six) hours. 10/29/15   Tilden Fossa, MD  valsartan-hydrochlorothiazide (DIOVAN-HCT) 160-12.5 MG tablet Take 1 tablet by mouth daily.  Historical Provider, MD    Family History No family history on file.  Social History Social History  Substance Use Topics  . Smoking status: Current Every Day Smoker  . Smokeless tobacco: Never Used  . Alcohol use No     Allergies   Decongest-aid [pseudoephedrine]; Levofloxacin; Nitrofuran derivatives; and Phenazopyridine   Review of Systems Review of Systems  Constitutional: Positive for fever. Negative for chills.  Gastrointestinal: Positive for abdominal pain and nausea. Negative for diarrhea and vomiting.  Musculoskeletal: Positive for myalgias.     Physical Exam Updated Vital Signs BP (!) 101/52    Pulse 73   Temp 99.4 F (37.4 C) (Oral)   Resp 22   Ht 5\' 3"  (1.6 m)   Wt 147 lb (66.7 kg)   SpO2 92%   BMI 26.04 kg/m   Physical Exam  Constitutional: She is oriented to person, place, and time. She appears well-developed and well-nourished. No distress.  HENT:  Head: Normocephalic and atraumatic.  Mouth/Throat: Mucous membranes are dry.  Eyes: EOM are normal.  Neck: Neck supple.  Cardiovascular: Normal rate, regular rhythm and normal heart sounds.  Exam reveals no gallop and no friction rub.   No murmur heard. Pulmonary/Chest: Effort normal and breath sounds normal. No respiratory distress. She has no wheezes. She has no rales.  Abdominal: Soft. She exhibits no distension. There is tenderness.  Left mid abdomen pain.   Musculoskeletal: Normal range of motion.  Neurological: She is alert and oriented to person, place, and time.  Skin: Skin is warm and dry.  Psychiatric: She has a normal mood and affect. Her behavior is normal.  Nursing note and vitals reviewed.    ED Treatments / Results  DIAGNOSTIC STUDIES: Oxygen Saturation is 95% on RA, adequate by my interpretation.    COORDINATION OF CARE: 6:17 PM Discussed treatment plan with pt at bedside which includes CXR, labs, UA, CT abdomen pelvis, and pt agreed to plan.   Labs (all labs ordered are listed, but only abnormal results are displayed) Labs Reviewed  COMPREHENSIVE METABOLIC PANEL - Abnormal; Notable for the following:       Result Value   Potassium 3.1 (*)    Glucose, Bld 102 (*)    Total Protein 6.1 (*)    All other components within normal limits  CBC WITH DIFFERENTIAL/PLATELET - Abnormal; Notable for the following:    Neutro Abs 7.9 (*)    All other components within normal limits  LIPASE, BLOOD  URINALYSIS, ROUTINE W REFLEX MICROSCOPIC  INFLUENZA PANEL BY PCR (TYPE A & B)   EKG   EKG Interpretation  Date/Time:  Sunday August 19 2016 18:32:30 EST Ventricular Rate:  82 PR Interval:    QRS  Duration: 104 QT Interval:  360 QTC Calculation: 421 R Axis:   19 Text Interpretation:  Sinus rhythm Low voltage, precordial leads Normal sinus rhythm Confirmed by Kandis Mannan ([id number]) on 08/19/2016 6:35:17 PM       Radiology Dg Chest 2 View  Result Date: 08/19/2016 CLINICAL DATA:  Fever and generalized body aches. EXAM: CHEST  2 VIEW COMPARISON:  12/05/2015 FINDINGS: Cardiopericardial silhouette is at upper limits of normal for size. The lungs are clear wiithout focal pneumonia, edema, pneumothorax or pleural effusion. Interstitial markings are diffusely coarsened with chronic features. Old right posterior rib fracture again noted. IMPRESSION: No active cardiopulmonary disease. Electronically Signed   By: Kennith Center M.D.   On: 08/19/2016 20:13   Ct Abdomen Pelvis W Contrast  Result Date:  08/19/2016 CLINICAL DATA:  Fever with generalized body aches and nausea. One-day history of abdominal pain. EXAM: CT ABDOMEN AND PELVIS WITH CONTRAST TECHNIQUE: Multidetector CT imaging of the abdomen and pelvis was performed using the standard protocol following bolus administration of intravenous contrast. CONTRAST:  ISOVUE-300 IOPAMIDOL (ISOVUE-300) INJECTION 61% COMPARISON:  06/19/2016 FINDINGS: Lower chest:  Basilar atelectasis. Hepatobiliary: No focal abnormality of the liver parenchyma. Question mild periportal edema. Gallbladder surgically absent. No intrahepatic or extrahepatic biliary dilation. Pancreas: No focal mass lesion. No dilatation of the main duct. No intraparenchymal cyst. No peripancreatic edema. Spleen: No splenomegaly. No focal mass lesion. Adrenals/Urinary Tract: No adrenal nodule or mass. 5.2 cm cyst upper pole right kidney, stable. 4 mm nonobstructing stone identified lower pole left kidney No evidence for hydroureter. The urinary bladder appears normal for the degree of distention. Stomach/Bowel: Stomach is nondistended. No gastric wall thickening. No evidence of outlet  obstruction. Duodenum is normally positioned as is the ligament of Treitz. No small bowel wall thickening. No small bowel dilatation. The terminal ileum is normal. The appendix is not visualized, but there is no edema or inflammation in the region of the cecum. Advanced diverticular changes noted sigmoid colon with some subtle pericolonic edema/ inflammation and ill definition of colonic wall in the proximal sigmoid segment. There may be a small amount of fluid in the sigmoid mesocolon is scattered small lymph nodes are seen in the mesocolon, as before. Vascular/Lymphatic: There is abdominal aortic atherosclerosis without aneurysm. No gastrohepatic ligament lymphadenopathy. Borderline hepatoduodenal ligament lymphadenopathy is stable. No retroperitoneal lymphadenopathy. No pelvic sidewall lymphadenopathy. Reproductive: The uterus has normal CT imaging appearance. There is no adnexal mass. Other: No intraperitoneal free fluid. Musculoskeletal: Bone windows reveal no worrisome lytic or sclerotic osseous lesions. IMPRESSION: 1. Advanced diverticular disease sigmoid colon. Subtle area of apparent wall edema and pericolonic edema/inflammation suggests diverticulitis. No evidence for perforation or abscess. As colorectal neoplasm can present with similar imaging features, close follow-up suggested. 2. Stable simple appearing cyst upper pole right kidney. 3. Nonobstructing stone lower pole left kidney. 4.  Abdominal Aortic Atherosclerois (ICD10-170.0) Electronically Signed   By: Kennith Center M.D.   On: 08/19/2016 20:27    Procedures Procedures (including critical care time)  Medications Ordered in ED Medications  piperacillin-tazobactam (ZOSYN) IVPB 3.375 g (3.375 g Intravenous New Bag/Given 08/19/16 2211)  sodium chloride 0.9 % bolus 1,000 mL (0 mLs Intravenous Stopped 08/19/16 2033)  iopamidol (ISOVUE-300) 61 % injection 100 mL (100 mLs Intravenous Contrast Given 08/19/16 1957)  ibuprofen (ADVIL,MOTRIN) tablet  600 mg (600 mg Oral Given 08/19/16 2044)  ondansetron (ZOFRAN) injection 4 mg (4 mg Intravenous Given 08/19/16 2231)     Initial Impression / Assessment and Plan / ED Course  I have reviewed the triage vital signs and the nursing notes.  Pertinent labs & imaging results that were available during my care of the patient were reviewed by me and considered in my medical decision making (see chart for details).     I personally performed the services described in this documentation, which was scribed in my presence. The recorded information has been reviewed and is accurate.   Patient is 71 year old female presenting today with fever and body aches. I suspect that this is flu. She has a viral like symptoms. However patient also has tenderness to palpation of the left mid quadrant on exam. We'll make sure the patient does not have any other source of infection in her chest, urine or abdomen.   She did  have recent injection in her right shoulder however I do not suspect any kind of septic arthritis at this point given her asymptomatic shoulder exam.  Curretnly  physical exam is reassuring with low BPs.   11:12 PM Patient had CAT scan due to the tenderness in her abdomen which showed acute diverticulitis. Patient's had a hard time keeping down any fluids since arrival. In addition patient has remained with low blood pressures. Despite 3 L of fluids been unable to getpatient's systolic blood pressure up of 161. Patient still feels very poorly.  We will admit given presumptive diagnosis of fluid in the 71 year old with diverticulitis at same time    Final Clinical Impressions(s) / ED Diagnoses   Final diagnoses:  Influenza  Diverticulitis of intestine without bleeding, unspecified complication status, unspecified part of intestinal tract    New Prescriptions New Prescriptions   No medications on file     Hema Lanza Randall An, MD 08/19/16 2312

## 2016-08-19 NOTE — ED Notes (Signed)
Pt began c/o mid sternal epigastric chest pains and increasing upper mid back pain.

## 2016-08-19 NOTE — ED Triage Notes (Signed)
Patient states she was sitting on the couch when she had a sudden onset of generalized body aches, headache and shaking.  Skin temperature felt hot.  Took 1000mg  tylenol.

## 2016-08-19 NOTE — ED Notes (Signed)
Placed on O2 2L Wade for SPO2 88-92.

## 2016-08-19 NOTE — ED Notes (Signed)
Up to b/r via "steady" and back to stretcher w/o incident or change. VSS. [facility] here for transport.

## 2016-08-19 NOTE — ED Notes (Signed)
Dr. Mackuen at BS.  

## 2016-08-19 NOTE — ED Triage Notes (Signed)
[facility] reports patient had a sudden onset of shaking, feeling hot and nausea today 1500.  Tylenol 1000mg  given po just pta by family.  VS: 120/  HR 80,  RR 16, CBG: 99.

## 2016-08-19 NOTE — ED Notes (Signed)
Pt attempted obtaining urine sample on bedpan. No success.

## 2016-08-19 NOTE — Progress Notes (Signed)
Accepted to [facility] stepdown unit under observation status for influenza-like-illness with abdominal pain. Labs look okay, but febrile to 102.5 with persistently soft BP, MAP 65-70 after 3 liters NS. CT abd/pelvis suggests acute diverticulitis and she is started on Zosyn. Flu PCR pending. Signed-out by Dr. Corlis Leak.

## 2016-08-20 ENCOUNTER — Encounter (HOSPITAL_COMMUNITY): Payer: Self-pay | Admitting: *Deleted

## 2016-08-20 DIAGNOSIS — F17200 Nicotine dependence, unspecified, uncomplicated: Secondary | ICD-10-CM | POA: Diagnosis not present

## 2016-08-20 DIAGNOSIS — E876 Hypokalemia: Secondary | ICD-10-CM | POA: Diagnosis not present

## 2016-08-20 DIAGNOSIS — K5792 Diverticulitis of intestine, part unspecified, without perforation or abscess without bleeding: Secondary | ICD-10-CM | POA: Diagnosis present

## 2016-08-20 DIAGNOSIS — R6510 Systemic inflammatory response syndrome (SIRS) of non-infectious origin without acute organ dysfunction: Secondary | ICD-10-CM | POA: Diagnosis present

## 2016-08-20 DIAGNOSIS — G4700 Insomnia, unspecified: Secondary | ICD-10-CM | POA: Diagnosis not present

## 2016-08-20 DIAGNOSIS — M810 Age-related osteoporosis without current pathological fracture: Secondary | ICD-10-CM | POA: Diagnosis not present

## 2016-08-20 DIAGNOSIS — Z9889 Other specified postprocedural states: Secondary | ICD-10-CM | POA: Diagnosis not present

## 2016-08-20 DIAGNOSIS — Z79899 Other long term (current) drug therapy: Secondary | ICD-10-CM | POA: Diagnosis not present

## 2016-08-20 DIAGNOSIS — Z881 Allergy status to other antibiotic agents status: Secondary | ICD-10-CM | POA: Diagnosis not present

## 2016-08-20 DIAGNOSIS — K5732 Diverticulitis of large intestine without perforation or abscess without bleeding: Secondary | ICD-10-CM | POA: Diagnosis not present

## 2016-08-20 DIAGNOSIS — Z7982 Long term (current) use of aspirin: Secondary | ICD-10-CM | POA: Diagnosis not present

## 2016-08-20 DIAGNOSIS — R740 Nonspecific elevation of levels of transaminase and lactic acid dehydrogenase [LDH]: Secondary | ICD-10-CM | POA: Diagnosis not present

## 2016-08-20 DIAGNOSIS — J111 Influenza due to unidentified influenza virus with other respiratory manifestations: Secondary | ICD-10-CM | POA: Diagnosis present

## 2016-08-20 DIAGNOSIS — I10 Essential (primary) hypertension: Secondary | ICD-10-CM | POA: Diagnosis not present

## 2016-08-20 DIAGNOSIS — Z888 Allergy status to other drugs, medicaments and biological substances status: Secondary | ICD-10-CM | POA: Diagnosis not present

## 2016-08-20 DIAGNOSIS — K219 Gastro-esophageal reflux disease without esophagitis: Secondary | ICD-10-CM | POA: Diagnosis not present

## 2016-08-20 DIAGNOSIS — R000 Tachycardia, unspecified: Secondary | ICD-10-CM | POA: Diagnosis not present

## 2016-08-20 DIAGNOSIS — R17 Unspecified jaundice: Secondary | ICD-10-CM | POA: Diagnosis not present

## 2016-08-20 DIAGNOSIS — Z8249 Family history of ischemic heart disease and other diseases of the circulatory system: Secondary | ICD-10-CM | POA: Diagnosis not present

## 2016-08-20 LAB — COMPREHENSIVE METABOLIC PANEL
ALK PHOS: 72 U/L (ref 38–126)
ALT: 126 U/L — ABNORMAL HIGH (ref 14–54)
ANION GAP: 8 (ref 5–15)
AST: 158 U/L — ABNORMAL HIGH (ref 15–41)
Albumin: 2.7 g/dL — ABNORMAL LOW (ref 3.5–5.0)
BUN: 12 mg/dL (ref 6–20)
CALCIUM: 8.8 mg/dL — AB (ref 8.9–10.3)
CO2: 19 mmol/L — ABNORMAL LOW (ref 22–32)
Chloride: 110 mmol/L (ref 101–111)
Creatinine, Ser: 0.94 mg/dL (ref 0.44–1.00)
GFR calc non Af Amer: 60 mL/min — ABNORMAL LOW (ref >60)
Glucose, Bld: 120 mg/dL — ABNORMAL HIGH (ref 65–99)
Potassium: 4 mmol/L (ref 3.5–5.1)
SODIUM: 137 mmol/L (ref 135–145)
TOTAL PROTEIN: 4.8 g/dL — AB (ref 6.5–8.1)
Total Bilirubin: 1 mg/dL (ref 0.3–1.2)

## 2016-08-20 LAB — LACTIC ACID, PLASMA: Lactic Acid, Venous: 1.5 mmol/L (ref 0.5–1.9)

## 2016-08-20 LAB — CBC WITH DIFFERENTIAL/PLATELET
BASOS ABS: 0 10*3/uL (ref 0.0–0.1)
BASOS PCT: 0 %
Eosinophils Absolute: 0 10*3/uL (ref 0.0–0.7)
Eosinophils Relative: 0 %
HEMATOCRIT: 33.6 % — AB (ref 36.0–46.0)
HEMOGLOBIN: 10.7 g/dL — AB (ref 12.0–15.0)
Lymphocytes Relative: 11 %
Lymphs Abs: 0.8 10*3/uL (ref 0.7–4.0)
MCH: 29.6 pg (ref 26.0–34.0)
MCHC: 31.8 g/dL (ref 30.0–36.0)
MCV: 93.1 fL (ref 78.0–100.0)
Monocytes Absolute: 0.1 10*3/uL (ref 0.1–1.0)
Monocytes Relative: 1 %
NEUTROS PCT: 88 %
Neutro Abs: 6 10*3/uL (ref 1.7–7.7)
Platelets: 131 10*3/uL — ABNORMAL LOW (ref 150–400)
RBC: 3.61 MIL/uL — AB (ref 3.87–5.11)
RDW: 13.9 % (ref 11.5–15.5)
WBC: 6.9 10*3/uL (ref 4.0–10.5)

## 2016-08-20 LAB — INFLUENZA PANEL BY PCR (TYPE A & B)
INFLAPCR: NEGATIVE
INFLBPCR: NEGATIVE
INFLBPCR: NEGATIVE
Influenza A By PCR: NEGATIVE

## 2016-08-20 LAB — MRSA PCR SCREENING: MRSA by PCR: NEGATIVE

## 2016-08-20 MED ORDER — ACETAMINOPHEN 325 MG PO TABS
650.0000 mg | ORAL_TABLET | Freq: Four times a day (QID) | ORAL | Status: DC | PRN
  Administered 2016-08-22: 650 mg via ORAL
  Filled 2016-08-20 (×2): qty 2

## 2016-08-20 MED ORDER — ONDANSETRON HCL 4 MG PO TABS
4.0000 mg | ORAL_TABLET | Freq: Four times a day (QID) | ORAL | Status: DC | PRN
  Administered 2016-08-20: 4 mg via ORAL
  Filled 2016-08-20: qty 1

## 2016-08-20 MED ORDER — SODIUM CHLORIDE 0.9 % IV SOLN
INTRAVENOUS | Status: DC
  Administered 2016-08-21: 01:00:00 via INTRAVENOUS

## 2016-08-20 MED ORDER — SERTRALINE HCL 50 MG PO TABS
50.0000 mg | ORAL_TABLET | Freq: Every day | ORAL | Status: DC
  Administered 2016-08-20 – 2016-08-21 (×2): 50 mg via ORAL
  Filled 2016-08-20 (×3): qty 1

## 2016-08-20 MED ORDER — ACETAMINOPHEN 650 MG RE SUPP: 650.0000 mg | Freq: Four times a day (QID) | RECTAL | Status: DC | PRN

## 2016-08-20 MED ORDER — DICYCLOMINE HCL 20 MG PO TABS
20.0000 mg | ORAL_TABLET | Freq: Three times a day (TID) | ORAL | Status: DC | PRN
  Administered 2016-08-20 – 2016-08-21 (×2): 20 mg via ORAL
  Filled 2016-08-20 (×2): qty 1

## 2016-08-20 MED ORDER — ENOXAPARIN SODIUM 40 MG/0.4ML ~~LOC~~ SOLN
40.0000 mg | SUBCUTANEOUS | Status: DC
  Filled 2016-08-20 (×3): qty 0.4

## 2016-08-20 MED ORDER — ONDANSETRON HCL 4 MG/2ML IJ SOLN
4.0000 mg | Freq: Four times a day (QID) | INTRAMUSCULAR | Status: DC | PRN
  Administered 2016-08-20 – 2016-08-21 (×4): 4 mg via INTRAVENOUS
  Filled 2016-08-20 (×4): qty 2

## 2016-08-20 MED ORDER — ACETAMINOPHEN 325 MG PO TABS
650.0000 mg | ORAL_TABLET | Freq: Once | ORAL | Status: AC
  Administered 2016-08-20: 650 mg via ORAL
  Filled 2016-08-20: qty 2

## 2016-08-20 NOTE — H&P (Addendum)
History and Physical    Sheila Davenport ZOX:[id number] DOB: 1945/08/04 DOA: 08/19/2016  PCP: No PCP Per Patient  Patient coming from: Home.  Chief Complaint: Fever and chills and weakness.  HPI: Sheila Davenport is a 71 y.o. female with hypertension and previous history of diverticulitis presents to the ER because of fever chills or weakness. Patient's symptoms started suddenly last evening around 3 PM while at home. Patient also was having generalized body ache. Denies any chest pain and productive cough nausea vomiting or diarrhea. In the ER patient was found to have low normal blood pressure tachycardic and mildly tachypneic. Patient had a fever of 102F. ER physician on exam found the patient was having tenderness of the left lower quadrant of the abdomen. CT scan of the abdomen done showed possible sigmoid diverticulitis. Patient was given fluid bolus and Zosyn. Influenza PCR is pending. Admitted for further observation. On my exam patient does not have any tenderness at this time of the left lower quadrant. Patient states he feels better after getting the IV fluids but still weak.   ED Course: Patient was given fluid bolus and started on Zosyn after CT scan shows possible sigmoid diverticulitis.  Review of Systems: As per HPI, rest all negative.   Past Medical History:  Diagnosis Date  . Diverticulosis   . Dyslipidemia   . GERD (gastroesophageal reflux disease)   . Hypertension   . Osteoporosis     Past Surgical History:  Procedure Laterality Date  . HAND SURGERY Right   . NASAL SINUS SURGERY    . REPLACEMENT TOTAL KNEE Right      reports that she has been smoking.  She has never used smokeless tobacco. She reports that she does not drink alcohol or use drugs.  Allergies  Allergen Reactions  . Decongest-Aid [Pseudoephedrine]   . Levofloxacin   . Nitrofuran Derivatives   . Phenazopyridine     Family History  Problem Relation Age of Onset  . Hypertension Other     Prior  to Admission medications   Medication Sig Start Date End Date Taking? Authorizing Provider  AMLODIPINE BESY-BENAZEPRIL HCL PO Take by mouth.   Yes Historical Provider, MD  aspirin 81 MG tablet Take 81 mg by mouth daily.   Yes Historical Provider, MD  atenolol (TENORMIN) 50 MG tablet Take 50 mg by mouth daily.   Yes Historical Provider, MD  BIOGAIA PROBIOTIC (BIOGAIA PROBIOTIC) LIQD Take by mouth daily at 8 pm.   Yes Historical Provider, MD  calcium-vitamin D (OSCAL WITH D) 500-200 MG-UNIT tablet Take 1 tablet by mouth.   Yes Historical Provider, MD  cholecalciferol (VITAMIN D) 400 units TABS tablet Take 800 Units by mouth.   Yes Historical Provider, MD  Cranberry 400 MG CAPS Take by mouth.   Yes Historical Provider, MD  meclizine (ANTIVERT) 25 MG tablet Take 25 mg by mouth 3 (three) times daily as needed for dizziness.   Yes Historical Provider, MD  Multiple Vitamins-Minerals (MULTIVITAMIN WITH MINERALS) tablet Take 1 tablet by mouth daily.   Yes Historical Provider, MD  ranitidine (ZANTAC) 150 MG tablet Take 150 mg by mouth 2 (two) times daily.   Yes Historical Provider, MD  sertraline (ZOLOFT) 50 MG tablet Take 50 mg by mouth daily.   Yes Historical Provider, MD  valsartan (DIOVAN) 160 MG tablet Take 160 mg by mouth daily.   Yes Historical Provider, MD  azithromycin (ZITHROMAX) 250 MG tablet Take 1 tablet (250 mg total) by mouth daily. Take first  2 tablets together, then 1 every day until finished. 10/29/15   Tilden Fossa, MD  ondansetron (ZOFRAN ODT) 8 MG disintegrating tablet Take 1 tablet (8 mg total) by mouth every 8 (eight) hours as needed for nausea or vomiting. 05/21/16   John Molpus, MD  ondansetron (ZOFRAN) 4 MG tablet Take 1 tablet (4 mg total) by mouth every 6 (six) hours. 10/29/15   Tilden Fossa, MD  valsartan-hydrochlorothiazide (DIOVAN-HCT) 160-12.5 MG tablet Take 1 tablet by mouth daily.    Historical Provider, MD    Physical Exam: Vitals:   08/19/16 2333 08/19/16 2334  08/20/16 0056 08/20/16 0307  BP: 110/70  (!) 112/53 (!) 106/49  Pulse:  65 62 (!) 56  Resp:    (!) 21  Temp:   98.6 F (37 C) 98 F (36.7 C)  TempSrc:   Oral Oral  SpO2:  95% 95% 96%  Weight:   66.7 kg (147 lb)   Height:   5\' 3"  (1.6 m)       Constitutional: Moderately built and nourished. Vitals:   08/19/16 2333 08/19/16 2334 08/20/16 0056 08/20/16 0307  BP: 110/70  (!) 112/53 (!) 106/49  Pulse:  65 62 (!) 56  Resp:    (!) 21  Temp:   98.6 F (37 C) 98 F (36.7 C)  TempSrc:   Oral Oral  SpO2:  95% 95% 96%  Weight:   66.7 kg (147 lb)   Height:   5\' 3"  (1.6 m)    Eyes: Anicteric no pallor. ENMT: No discharge from the ears eyes nose or mouth. Neck: No mass felt. No neck rigidity. Respiratory: No rhonchi or crepitations. Cardiovascular: S1 and S2 heard no murmurs appreciated. Abdomen: Soft nontender bowel sounds present. Musculoskeletal: No edema no joint effusion. Skin: No rash. Skin appears warm. Neurologic: Alert awake oriented to time place and person. Moves all extremities. Psychiatric: Appears normal. Normal affect.   Labs on Admission: I have personally reviewed following labs and imaging studies  CBC:  Recent Labs Lab 08/19/16 1830  WBC 8.8  NEUTROABS 7.9*  HGB 12.1  HCT 37.4  MCV 93.3  PLT 200   Basic Metabolic Panel:  Recent Labs Lab 08/19/16 1830  NA 137  K 3.1*  CL 107  CO2 23  GLUCOSE 102*  BUN 17  CREATININE 0.88  CALCIUM 10.2   GFR: Estimated Creatinine Clearance: 53.8 mL/min (by C-G formula based on SCr of 0.88 mg/dL). Liver Function Tests:  Recent Labs Lab 08/19/16 1830  AST 23  ALT 15  ALKPHOS 64  BILITOT 0.6  PROT 6.1*  ALBUMIN 3.5    Recent Labs Lab 08/19/16 1830  LIPASE 24   No results for input(s): AMMONIA in the last 168 hours. Coagulation Profile: No results for input(s): INR, PROTIME in the last 168 hours. Cardiac Enzymes: No results for input(s): CKTOTAL, CKMB, CKMBINDEX, TROPONINI in the last 168  hours. BNP (last 3 results) No results for input(s): PROBNP in the last 8760 hours. HbA1C: No results for input(s): HGBA1C in the last 72 hours. CBG: No results for input(s): GLUCAP in the last 168 hours. Lipid Profile: No results for input(s): CHOL, HDL, LDLCALC, TRIG, CHOLHDL, LDLDIRECT in the last 72 hours. Thyroid Function Tests: No results for input(s): TSH, T4TOTAL, FREET4, T3FREE, THYROIDAB in the last 72 hours. Anemia Panel: No results for input(s): VITAMINB12, FOLATE, FERRITIN, TIBC, IRON, RETICCTPCT in the last 72 hours. Urine analysis:    Component Value Date/Time   COLORURINE YELLOW 08/19/2016 2011  APPEARANCEUR CLEAR 08/19/2016 2011   LABSPEC 1.025 08/19/2016 2011   PHURINE 7.0 08/19/2016 2011   GLUCOSEU NEGATIVE 08/19/2016 2011   HGBUR NEGATIVE 08/19/2016 2011   BILIRUBINUR NEGATIVE 08/19/2016 2011   KETONESUR NEGATIVE 08/19/2016 2011   PROTEINUR NEGATIVE 08/19/2016 2011   NITRITE NEGATIVE 08/19/2016 2011   LEUKOCYTESUR NEGATIVE 08/19/2016 2011   Sepsis Labs: @LABRCNTIP (procalcitonin:4,lacticidven:4) )No results found for this or any previous visit (from the past 240 hour(s)).   Radiological Exams on Admission: Dg Chest 2 View  Result Date: 08/19/2016 CLINICAL DATA:  Fever and generalized body aches. EXAM: CHEST  2 VIEW COMPARISON:  12/05/2015 FINDINGS: Cardiopericardial silhouette is at upper limits of normal for size. The lungs are clear wiithout focal pneumonia, edema, pneumothorax or pleural effusion. Interstitial markings are diffusely coarsened with chronic features. Old right posterior rib fracture again noted. IMPRESSION: No active cardiopulmonary disease. Electronically Signed   By: Kennith Center M.D.   On: 08/19/2016 20:13   Ct Abdomen Pelvis W Contrast  Result Date: 08/19/2016 CLINICAL DATA:  Fever with generalized body aches and nausea. One-day history of abdominal pain. EXAM: CT ABDOMEN AND PELVIS WITH CONTRAST TECHNIQUE: Multidetector CT imaging of  the abdomen and pelvis was performed using the standard protocol following bolus administration of intravenous contrast. CONTRAST:  ISOVUE-300 IOPAMIDOL (ISOVUE-300) INJECTION 61% COMPARISON:  06/19/2016 FINDINGS: Lower chest:  Basilar atelectasis. Hepatobiliary: No focal abnormality of the liver parenchyma. Question mild periportal edema. Gallbladder surgically absent. No intrahepatic or extrahepatic biliary dilation. Pancreas: No focal mass lesion. No dilatation of the main duct. No intraparenchymal cyst. No peripancreatic edema. Spleen: No splenomegaly. No focal mass lesion. Adrenals/Urinary Tract: No adrenal nodule or mass. 5.2 cm cyst upper pole right kidney, stable. 4 mm nonobstructing stone identified lower pole left kidney No evidence for hydroureter. The urinary bladder appears normal for the degree of distention. Stomach/Bowel: Stomach is nondistended. No gastric wall thickening. No evidence of outlet obstruction. Duodenum is normally positioned as is the ligament of Treitz. No small bowel wall thickening. No small bowel dilatation. The terminal ileum is normal. The appendix is not visualized, but there is no edema or inflammation in the region of the cecum. Advanced diverticular changes noted sigmoid colon with some subtle pericolonic edema/ inflammation and ill definition of colonic wall in the proximal sigmoid segment. There may be a small amount of fluid in the sigmoid mesocolon is scattered small lymph nodes are seen in the mesocolon, as before. Vascular/Lymphatic: There is abdominal aortic atherosclerosis without aneurysm. No gastrohepatic ligament lymphadenopathy. Borderline hepatoduodenal ligament lymphadenopathy is stable. No retroperitoneal lymphadenopathy. No pelvic sidewall lymphadenopathy. Reproductive: The uterus has normal CT imaging appearance. There is no adnexal mass. Other: No intraperitoneal free fluid. Musculoskeletal: Bone windows reveal no worrisome lytic or sclerotic osseous  lesions. IMPRESSION: 1. Advanced diverticular disease sigmoid colon. Subtle area of apparent wall edema and pericolonic edema/inflammation suggests diverticulitis. No evidence for perforation or abscess. As colorectal neoplasm can present with similar imaging features, close follow-up suggested. 2. Stable simple appearing cyst upper pole right kidney. 3. Nonobstructing stone lower pole left kidney. 4.  Abdominal Aortic Atherosclerois (ICD10-170.0) Electronically Signed   By: Kennith Center M.D.   On: 08/19/2016 20:27    EKG: Independently reviewed. Normal sinus rhythm.  Assessment/Plan Principal Problem:   SIRS (systemic inflammatory response syndrome) (HCC) Active Problems:   Fever   Diverticulitis of intestine without bleeding    1. SIRS - since patient was febrile hypotensive mildly tachypneic patient meets SIRS criteria. Blood cultures  have been sent. Likely source could be sigmoid diverticulitis. Influenza PCR is pending. Continue hydration. 2. Hypertension - holding antihypertensives for now. When necessary IV hydralazine for systolic blood pressure more than 160.   DVT prophylaxis: Lovenox. Code Status: Full code.  Family Communication: Patient's husband.  Disposition Plan: Home.  Consults called: None.  Admission status: Observation.    Eduard Clos MD [facility] Pager [phone].  If 7PM-7AM, please contact night-coverage [email or web address] Password [email or web address]  08/20/2016, 3:35 AM

## 2016-08-20 NOTE — Progress Notes (Signed)
ANTIBIOTIC CONSULT NOTE - INITIAL  Pharmacy Consult for Zosyn Indication: Intra-abdominal infection  Allergies  Allergen Reactions  . Decongest-Aid [Pseudoephedrine]   . Levofloxacin   . Nitrofuran Derivatives   . Phenazopyridine     Patient Measurements: Height: 5\' 3"  (160 cm) Weight: 147 lb (66.7 kg) IBW/kg (Calculated) : 52.4 Adjusted Body Weight:    Vital Signs: Temp: 98 F (36.7 C) (02/19 0307) Temp Source: Oral (02/19 0307) BP: 106/49 (02/19 0307) Pulse Rate: 56 (02/19 0307) Intake/Output from previous day: 02/18 0701 - 02/19 0700 In: 3000 [P.O.:500; I.V.:1500; IV Piggyback:1000] Out: -  Intake/Output from this shift: Total I/O In: 3000 [P.O.:500; I.V.:1500; IV Piggyback:1000] Out: -   Labs:  Recent Labs  08/19/16 1830  WBC 8.8  HGB 12.1  PLT 200  CREATININE 0.88   Estimated Creatinine Clearance: 53.8 mL/min (by C-G formula based on SCr of 0.88 mg/dL). No results for input(s): VANCOTROUGH, VANCOPEAK, VANCORANDOM, GENTTROUGH, GENTPEAK, GENTRANDOM, TOBRATROUGH, TOBRAPEAK, TOBRARND, AMIKACINPEAK, AMIKACINTROU, AMIKACIN in the last 72 hours.   Microbiology: No results found for this or any previous visit (from the past 720 hour(s)).  Medical History: Past Medical History:  Diagnosis Date  . Diverticulosis   . Dyslipidemia   . GERD (gastroesophageal reflux disease)   . Hypertension   . Osteoporosis     Medications:  F/u med rec  Assessment: CC/HPI: sudden onset of shaking, feeling hot and nausea today with fever, and generalized body aches and abdominal pain. Tender to palpation on L mid-quadrant. CT=acute diverticulitis.  - Tmax 102.5WBC 8.8, H/H 12.1/37.4, Plts 200, Scr 0.88, est CrCl 53  Goal of Therapy:  Eradication of infection  Plan:  Zosyn 3.375g IV q8hr. Dose ok for CrCl>20 Pharmacy will sign off. Please reconsult for further dosing assitance.   Roda Lauture S. Merilynn Finland, PharmD, BCPS Clinical Staff Pharmacist Pager  [phone]  Misty Stanley Stillinger 08/20/2016,3:43 AM

## 2016-08-20 NOTE — Care Management Note (Addendum)
Case Management Note  Patient Details  Name: Maven Rosander MRN: [id number] Date of Birth: 10-Dec-1945  Subjective/Objective:   Patient presents with SIRS,  Febrile, tachypneic, ? Sigmoid diverticulits, infulenza is negative, bp low.  When able to tolerate po's can dc. She lives with spouse, pta indep, she has a cane and a shower chair at home, she has a pcp, and medication coverage and transport at dc.    NCM will cont to follow for dc needs.                  Action/Plan:   Expected Discharge Date:  08/22/16               Expected Discharge Plan:  Home/Self Care  In-House Referral:     Discharge planning Services  CM Consult  Post Acute Care Choice:    Choice offered to:     DME Arranged:    DME Agency:     HH Arranged:    HH Agency:     Status of Service:  In process, will continue to follow  If discussed at Long Length of Stay Meetings, dates discussed:    Additional Comments:  Leone Haven, RN 08/20/2016, 3:49 PM

## 2016-08-20 NOTE — Progress Notes (Signed)
1855 Received pt from 4E via wheelchair. A&O x4. Made comfortable in bed, call light with in reach. Mild abd cramps noted.

## 2016-08-20 NOTE — Progress Notes (Signed)
Patient admitted early this morning. See H&P for full details.   71yo F with h/o diverticulitis presented to the ED with fever, chills and weakness of abrupt onset 2/18 at 3pm. In the ED she had a fever to 102F, tachycardia, tachypnea, and low-normal BP. She had LLQ abdominal tenderness and CT abd/pelvis showed inflammation around sigmoid diverticuli. Flu was checked and is negative. IVF and IV antibiotics were administered though the patient reports this morning that she is only mildly improved.   On exam this morning she appears weak but in no distress and exhibits significant LLQ tenderness without rebound.   Sepsis due to sigmoid diverticulitis: Most convincing source of sepsis (fever, tachycardia, tachypnea). UA wnl, CXR clear, flu negative x2, and lactate wnl. Seen at [facility] by Dr. Loman Chroman. Had recent colonoscopy 07/20/2016 with a tubular adenoma and sigmoid lipoma biopsied. No microscopic colitis seen.  - Continue IV antibiotics as she continues to be intolerant of per oral intake.  - Patient can go home once able to tolerate po. Will have her follow up with GI as outpatient. - Continue IVF's  Transaminitis: Acutely up overnight. No likely provocative medications. CT showed possible periportal edema but no other indication of hepatic changes.  - Monitor CMP - Hepatitis panel   HTN: Chronic, stable.  - Holding BP medications with low-to-normal BPs currently.  Hazeline Junker, MD 08/20/2016 3:29 PM

## 2016-08-21 DIAGNOSIS — J111 Influenza due to unidentified influenza virus with other respiratory manifestations: Secondary | ICD-10-CM | POA: Diagnosis not present

## 2016-08-21 DIAGNOSIS — R197 Diarrhea, unspecified: Secondary | ICD-10-CM | POA: Diagnosis not present

## 2016-08-21 DIAGNOSIS — E876 Hypokalemia: Secondary | ICD-10-CM | POA: Diagnosis not present

## 2016-08-21 DIAGNOSIS — K5732 Diverticulitis of large intestine without perforation or abscess without bleeding: Secondary | ICD-10-CM | POA: Diagnosis not present

## 2016-08-21 DIAGNOSIS — K5792 Diverticulitis of intestine, part unspecified, without perforation or abscess without bleeding: Secondary | ICD-10-CM | POA: Diagnosis not present

## 2016-08-21 DIAGNOSIS — R6510 Systemic inflammatory response syndrome (SIRS) of non-infectious origin without acute organ dysfunction: Secondary | ICD-10-CM | POA: Diagnosis not present

## 2016-08-21 LAB — C DIFFICILE QUICK SCREEN W PCR REFLEX
C DIFFICILE (CDIFF) INTERP: NOT DETECTED
C Diff antigen: NEGATIVE
C Diff toxin: NEGATIVE

## 2016-08-21 LAB — COMPREHENSIVE METABOLIC PANEL
ALBUMIN: 2.6 g/dL — AB (ref 3.5–5.0)
ALK PHOS: 90 U/L (ref 38–126)
ALT: 106 U/L — ABNORMAL HIGH (ref 14–54)
AST: 74 U/L — AB (ref 15–41)
Anion gap: 7 (ref 5–15)
BUN: 11 mg/dL (ref 6–20)
CALCIUM: 8.8 mg/dL — AB (ref 8.9–10.3)
CO2: 19 mmol/L — AB (ref 22–32)
Chloride: 110 mmol/L (ref 101–111)
Creatinine, Ser: 0.9 mg/dL (ref 0.44–1.00)
GFR calc Af Amer: >60 mL/min (ref >60)
GFR calc non Af Amer: >60 mL/min (ref >60)
GLUCOSE: 99 mg/dL (ref 65–99)
Potassium: 3.2 mmol/L — ABNORMAL LOW (ref 3.5–5.1)
SODIUM: 136 mmol/L (ref 135–145)
Total Bilirubin: 1.7 mg/dL — ABNORMAL HIGH (ref 0.3–1.2)
Total Protein: 4.8 g/dL — ABNORMAL LOW (ref 6.5–8.1)

## 2016-08-21 LAB — CBC
HEMATOCRIT: 33.2 % — AB (ref 36.0–46.0)
HEMOGLOBIN: 10.8 g/dL — AB (ref 12.0–15.0)
MCH: 29.4 pg (ref 26.0–34.0)
MCHC: 32.5 g/dL (ref 30.0–36.0)
MCV: 90.5 fL (ref 78.0–100.0)
Platelets: 113 10*3/uL — ABNORMAL LOW (ref 150–400)
RBC: 3.67 MIL/uL — AB (ref 3.87–5.11)
RDW: 14.3 % (ref 11.5–15.5)
WBC: 9.2 10*3/uL (ref 4.0–10.5)

## 2016-08-21 MED ORDER — SERTRALINE HCL 50 MG PO TABS
50.0000 mg | ORAL_TABLET | Freq: Once | ORAL | Status: AC
  Administered 2016-08-21: 50 mg via ORAL
  Filled 2016-08-21: qty 1

## 2016-08-21 MED ORDER — DIPHENOXYLATE-ATROPINE 2.5-0.025 MG PO TABS
2.0000 | ORAL_TABLET | Freq: Four times a day (QID) | ORAL | Status: DC | PRN
  Administered 2016-08-21: 2 via ORAL
  Filled 2016-08-21: qty 2

## 2016-08-21 MED ORDER — TRAZODONE HCL 50 MG PO TABS
50.0000 mg | ORAL_TABLET | Freq: Every day | ORAL | Status: DC
  Administered 2016-08-22: 50 mg via ORAL
  Filled 2016-08-21: qty 1
  Filled 2016-08-21: qty 2

## 2016-08-21 MED ORDER — POTASSIUM CHLORIDE CRYS ER 20 MEQ PO TBCR
40.0000 meq | EXTENDED_RELEASE_TABLET | Freq: Once | ORAL | Status: AC
  Administered 2016-08-21: 40 meq via ORAL
  Filled 2016-08-21: qty 2

## 2016-08-21 MED ORDER — SERTRALINE HCL 100 MG PO TABS
100.0000 mg | ORAL_TABLET | Freq: Every day | ORAL | Status: DC
  Administered 2016-08-22 – 2016-08-23 (×2): 100 mg via ORAL
  Filled 2016-08-21 (×2): qty 1

## 2016-08-21 MED ORDER — POTASSIUM CHLORIDE IN NACL 20-0.9 MEQ/L-% IV SOLN
INTRAVENOUS | Status: AC
  Administered 2016-08-21 – 2016-08-22 (×2): via INTRAVENOUS
  Filled 2016-08-21 (×3): qty 1000

## 2016-08-21 MED ORDER — GERHARDT'S BUTT CREAM
TOPICAL_CREAM | Freq: Four times a day (QID) | CUTANEOUS | Status: DC | PRN
  Filled 2016-08-21: qty 1

## 2016-08-21 NOTE — Progress Notes (Signed)
Patient had an emesis around 2315 from the Jello she ate and soda she drank.  PRN Zofran was given earlier at 2200 due to nausea and provided relief and then ate jello.  Patient experiencing stomach upset but refuse Maalox, just taking ice chips.  Patient now resting comfortably on bed with both eyes close.  Will monitor.

## 2016-08-21 NOTE — Progress Notes (Signed)
PROGRESS NOTE  Sheila Davenport  GNF:[id number] DOB: 01-Oct-1945 DOA: 08/19/2016 PCP: No PCP Per Patient  Outpatient Specialists: Dr. Flossie Buffy, GI  Brief Narrative: Sheila Davenport is a 71 y.o. female with a h/o diverticulitis who presented to [facility] with fever, chills and weakness of abrupt onset 2/18 at 3pm. In the ED she had a fever to 102F, tachycardia, tachypnea, and low-normal BP. She had LLQ abdominal tenderness and CT abd/pelvis showed inflammation around sigmoid diverticuli. Flu was checked and is negative. IVF and IV antibiotics were administered and the patient transferred to [facility] for admission. Symptoms transiently improved with IV fluids, though pain and nausea with vomiting have limited per oral intake dramatically. She's had significant bloating and diarrhea improved with bentyl, but continues to have loose stools about hourly.  Assessment & Plan: Principal Problem:   SIRS (systemic inflammatory response syndrome) (HCC) Active Problems:   Fever   Diverticulitis of intestine without bleeding   Acute diverticulitis  Sepsis due to sigmoid diverticulitis: Most convincing source of sepsis (fever, tachycardia, tachypnea). UA wnl, CXR clear, flu negative x2, and lactate wnl. Seen at [facility] by Dr. Loman Chroman. Had recent colonoscopy 07/20/2016 following diverticulitis bout in January, which showed a tubular adenoma and sigmoid lipoma biopsied. No microscopic colitis seen. Notes seem to indicate suspicion for IBS and she's displayed many of these symptoms improved with bentyl.  - Continue IV zosyn as she continues to be intolerant of per oral intake.  - Patient can go home once able to tolerate po. Encouraged to ask for antiemetics preemptively. Will have her follow up with GI as outpatient. - Continue IVF's - Will check CDiff and give lomotil if negative.   Transaminitis: Acutely up 2/18 > 2/19, improving. No likely provocative medications. CT showed possible periportal edema but no  other indication of hepatic changes.  - Monitor CMP - Hepatitis panel pending.   Hypokalemia: Due to ongoing GI losses.  - Replete by mouth x1 and in IV fluid.   HTN: Chronic, stable.  - Holding BP medications with low-to-normal BPs currently.  Insomnia: Chronic problem worsened in hospital.  - Trial trazodone qHS  DVT prophylaxis: Lovenox Code Status: Full Family Communication: Husband at bedside Disposition Plan: Ongoing inpatient management due to requirement for IV fluids and IV antibiotics. Can DC home once improved and tolerating po.   Consultants:   None  Procedures:   None  Antimicrobials:  Zosyn 2/18 >>    Subjective: Pt no better than yesterday. Didn't tolerate jello overnight due to vomiting. Has unchanged abdominal pain and bloating. No fevers. Having hourly loose, somewhat formed, stools without blood.   Objective: Vitals:   08/20/16 1450 08/20/16 1857 08/20/16 2052 08/21/16 0537  BP: 132/63 112/73 (!) 116/52 (!) 122/54  Pulse: 66 67 68 83  Resp: (!) 24 18 18 18   Temp: 98.7 F (37.1 C) 98.2 F (36.8 C) 99.5 F (37.5 C) 98.4 F (36.9 C)  TempSrc: Oral Oral Oral Oral  SpO2: 97% 93% 93% 92%  Weight:      Height:        Intake/Output Summary (Last 24 hours) at 08/21/16 1213 Last data filed at 08/21/16 1203  Gross per 24 hour  Intake          2344.58 ml  Output              300 ml  Net          2044.58 ml   Filed Weights   08/19/16 1710 08/20/16 0056  Weight: 66.7 kg (147 lb) 66.7 kg (147 lb)    Examination: General exam: Pleasant, tired-appearing 71 y.o. female in no distress Respiratory system: Non-labored breathing room air. Clear to auscultation bilaterally.  Cardiovascular system: Regular rate and rhythm. No murmur, rub, or gallop. No JVD, and no pedal edema. Gastrointestinal system: Abdomen diffusely tender with guarding but no rebound, unchanged from previous day's exam. Hyperactive bowel sounds. No organomegaly or masses  felt. Central nervous system: Alert and oriented. No focal neurological deficits. Extremities: Warm, no deformities Skin: No rashes, lesions no ulcers Psychiatry: Judgement and insight appear normal. Mood & affect appropriate.   Data Reviewed: I have personally reviewed following labs and imaging studies  CBC:  Recent Labs Lab 08/19/16 1830 08/20/16 0407 08/21/16 0430  WBC 8.8 6.9 9.2  NEUTROABS 7.9* 6.0  --   HGB 12.1 10.7* 10.8*  HCT 37.4 33.6* 33.2*  MCV 93.3 93.1 90.5  PLT 200 131* 113*   Basic Metabolic Panel:  Recent Labs Lab 08/19/16 1830 08/20/16 0407 08/21/16 0430  NA 137 137 136  K 3.1* 4.0 3.2*  CL 107 110 110  CO2 23 19* 19*  GLUCOSE 102* 120* 99  BUN 17 12 11   CREATININE 0.88 0.94 0.90  CALCIUM 10.2 8.8* 8.8*   GFR: Estimated Creatinine Clearance: 52.6 mL/min (by C-G formula based on SCr of 0.9 mg/dL). Liver Function Tests:  Recent Labs Lab 08/19/16 1830 08/20/16 0407 08/21/16 0430  AST 23 158* 74*  ALT 15 126* 106*  ALKPHOS 64 72 90  BILITOT 0.6 1.0 1.7*  PROT 6.1* 4.8* 4.8*  ALBUMIN 3.5 2.7* 2.6*    Recent Labs Lab 08/19/16 1830  LIPASE 24   No results for input(s): AMMONIA in the last 168 hours. Coagulation Profile: No results for input(s): INR, PROTIME in the last 168 hours. Cardiac Enzymes: No results for input(s): CKTOTAL, CKMB, CKMBINDEX, TROPONINI in the last 168 hours. BNP (last 3 results) No results for input(s): PROBNP in the last 8760 hours. HbA1C: No results for input(s): HGBA1C in the last 72 hours. CBG: No results for input(s): GLUCAP in the last 168 hours. Lipid Profile: No results for input(s): CHOL, HDL, LDLCALC, TRIG, CHOLHDL, LDLDIRECT in the last 72 hours. Thyroid Function Tests: No results for input(s): TSH, T4TOTAL, FREET4, T3FREE, THYROIDAB in the last 72 hours. Anemia Panel: No results for input(s): VITAMINB12, FOLATE, FERRITIN, TIBC, IRON, RETICCTPCT in the last 72 hours. Urine analysis:    Component  Value Date/Time   COLORURINE YELLOW 08/19/2016 2011   APPEARANCEUR CLEAR 08/19/2016 2011   LABSPEC 1.025 08/19/2016 2011   PHURINE 7.0 08/19/2016 2011   GLUCOSEU NEGATIVE 08/19/2016 2011   HGBUR NEGATIVE 08/19/2016 2011   BILIRUBINUR NEGATIVE 08/19/2016 2011   KETONESUR NEGATIVE 08/19/2016 2011   PROTEINUR NEGATIVE 08/19/2016 2011   NITRITE NEGATIVE 08/19/2016 2011   LEUKOCYTESUR NEGATIVE 08/19/2016 2011   Recent Results (from the past 240 hour(s))  MRSA PCR Screening     Status: None   Collection Time: 08/20/16  1:28 AM  Result Value Ref Range Status   MRSA by PCR NEGATIVE NEGATIVE Final    Comment:        The GeneXpert MRSA Assay (FDA approved for NASAL specimens only), is one component of a comprehensive MRSA colonization surveillance program. It is not intended to diagnose MRSA infection nor to guide or monitor treatment for MRSA infections.       Radiology Studies: Dg Chest 2 View  Result Date: 08/19/2016 CLINICAL DATA:  Fever and  generalized body aches. EXAM: CHEST  2 VIEW COMPARISON:  12/05/2015 FINDINGS: Cardiopericardial silhouette is at upper limits of normal for size. The lungs are clear wiithout focal pneumonia, edema, pneumothorax or pleural effusion. Interstitial markings are diffusely coarsened with chronic features. Old right posterior rib fracture again noted. IMPRESSION: No active cardiopulmonary disease. Electronically Signed   By: Kennith Center M.D.   On: 08/19/2016 20:13   Ct Abdomen Pelvis W Contrast  Result Date: 08/19/2016 CLINICAL DATA:  Fever with generalized body aches and nausea. One-day history of abdominal pain. EXAM: CT ABDOMEN AND PELVIS WITH CONTRAST TECHNIQUE: Multidetector CT imaging of the abdomen and pelvis was performed using the standard protocol following bolus administration of intravenous contrast. CONTRAST:  ISOVUE-300 IOPAMIDOL (ISOVUE-300) INJECTION 61% COMPARISON:  06/19/2016 FINDINGS: Lower chest:  Basilar atelectasis.  Hepatobiliary: No focal abnormality of the liver parenchyma. Question mild periportal edema. Gallbladder surgically absent. No intrahepatic or extrahepatic biliary dilation. Pancreas: No focal mass lesion. No dilatation of the main duct. No intraparenchymal cyst. No peripancreatic edema. Spleen: No splenomegaly. No focal mass lesion. Adrenals/Urinary Tract: No adrenal nodule or mass. 5.2 cm cyst upper pole right kidney, stable. 4 mm nonobstructing stone identified lower pole left kidney No evidence for hydroureter. The urinary bladder appears normal for the degree of distention. Stomach/Bowel: Stomach is nondistended. No gastric wall thickening. No evidence of outlet obstruction. Duodenum is normally positioned as is the ligament of Treitz. No small bowel wall thickening. No small bowel dilatation. The terminal ileum is normal. The appendix is not visualized, but there is no edema or inflammation in the region of the cecum. Advanced diverticular changes noted sigmoid colon with some subtle pericolonic edema/ inflammation and ill definition of colonic wall in the proximal sigmoid segment. There may be a small amount of fluid in the sigmoid mesocolon is scattered small lymph nodes are seen in the mesocolon, as before. Vascular/Lymphatic: There is abdominal aortic atherosclerosis without aneurysm. No gastrohepatic ligament lymphadenopathy. Borderline hepatoduodenal ligament lymphadenopathy is stable. No retroperitoneal lymphadenopathy. No pelvic sidewall lymphadenopathy. Reproductive: The uterus has normal CT imaging appearance. There is no adnexal mass. Other: No intraperitoneal free fluid. Musculoskeletal: Bone windows reveal no worrisome lytic or sclerotic osseous lesions. IMPRESSION: 1. Advanced diverticular disease sigmoid colon. Subtle area of apparent wall edema and pericolonic edema/inflammation suggests diverticulitis. No evidence for perforation or abscess. As colorectal neoplasm can present with similar  imaging features, close follow-up suggested. 2. Stable simple appearing cyst upper pole right kidney. 3. Nonobstructing stone lower pole left kidney. 4.  Abdominal Aortic Atherosclerois (ICD10-170.0) Electronically Signed   By: Kennith Center M.D.   On: 08/19/2016 20:27    Scheduled Meds: . enoxaparin (LOVENOX) injection  40 mg Subcutaneous Q24H  . piperacillin-tazobactam (ZOSYN)  IV  3.375 g Intravenous Q8H  . sertraline  50 mg Oral Daily   Continuous Infusions:   LOS: 1 day   Time spent: 25 minutes.  Hazeline Junker, MD [facility] Pager [phone]  If 7PM-7AM, please contact night-coverage [email or web address] Password [facility] 08/21/2016, 12:13 PM

## 2016-08-21 NOTE — Evaluation (Signed)
Physical Therapy Evaluation Patient Details Name: Sheila Davenport MRN: [id number] DOB: 07-24-1945 Today's Date: 08/21/2016   History of Present Illness  Patient is a 71 yo female admitted 08/19/16 with LLQ pain, fever, tachycardia, tachypnea, hypotension.  Patient with SIRS/sepsis due to signoid diverticulitis.    PMH:  HTN, diverticulitis, osteoporosis, Rt TKA  Clinical Impression  Patient presents with problems listed below.  Will benefit from acute PT to maximize functional mobility prior to discharge home with husband.  Patient with general weakness impacting balance and mobility/gait.  Recommend f/u HHPT at d/c for continued therapy.    Follow Up Recommendations Home health PT;Supervision for mobility/OOB    Equipment Recommendations  None recommended by PT    Recommendations for Other Services       Precautions / Restrictions Precautions Precautions: None Precaution Comments: mult episodes of diarrhea Restrictions Weight Bearing Restrictions: No      Mobility  Bed Mobility               General bed mobility comments: Patient in bathroom as PT entered room  Transfers Overall transfer level: Needs assistance Equipment used: 1 person hand held assist Transfers: Sit to/from Stand Sit to Stand: Min assist         General transfer comment: Verbal cues for hand placement.   Assist to steady during transfers.  Ambulation/Gait Ambulation/Gait assistance: Min assist Ambulation Distance (Feet): 40 Feet Assistive device: 1 person hand held assist Gait Pattern/deviations: Step-through pattern;Decreased stride length;Shuffle;Staggering left;Staggering right Gait velocity: decreased Gait velocity interpretation: Below normal speed for age/gender General Gait Details: Patient with slow, unsteady gait.  Patient with staggering to both sides.  Assist to steady during gait.  Stairs            Wheelchair Mobility    Modified Rankin (Stroke Patients Only)        Balance Overall balance assessment: Needs assistance         Standing balance support: Single extremity supported Standing balance-Leahy Scale: Poor                               Pertinent Vitals/Pain Pain Assessment: 0-10 Pain Score: 5  (during gait) Pain Location: abdomen Pain Descriptors / Indicators: Aching;Cramping Pain Intervention(s): Limited activity within patient's tolerance;Monitored during session;Repositioned    Home Living Family/patient expects to be discharged to:: Private residence Living Arrangements: Spouse/significant other Available Help at Discharge: Family;Available 24 hours/day Type of Home: House Home Access: Stairs to enter Entrance Stairs-Rails: Right Entrance Stairs-Number of Steps: 11 (2 steps onto landing and 11 to main floor) Home Layout: Two level;Able to live on main level with bedroom/bathroom Home Equipment: Dan Humphreys - 2 wheels;Cane - single point;Shower seat;Grab bars - tub/shower;Grab bars - toilet      Prior Function Level of Independence: Independent         Comments: Drives     Hand Dominance   Dominant Hand: Right    Extremity/Trunk Assessment   Upper Extremity Assessment Upper Extremity Assessment: Overall WFL for tasks assessed    Lower Extremity Assessment Lower Extremity Assessment: Generalized weakness    Cervical / Trunk Assessment Cervical / Trunk Assessment: Normal;Other exceptions Cervical / Trunk Exceptions: Abdomen distended  Communication   Communication: No difficulties  Cognition Arousal/Alertness: Awake/alert Behavior During Therapy: WFL for tasks assessed/performed Overall Cognitive Status: Within Functional Limits for tasks assessed  General Comments      Exercises     Assessment/Plan    PT Assessment Patient needs continued PT services  PT Problem List Decreased strength;Decreased activity tolerance;Decreased balance;Decreased mobility;Decreased  knowledge of use of DME;Pain       PT Treatment Interventions DME instruction;Gait training;Stair training;Functional mobility training;Therapeutic activities;Therapeutic exercise;Balance training;Patient/family education    PT Goals (Current goals can be found in the Care Plan section)  Acute Rehab PT Goals Patient Stated Goal: To feel better PT Goal Formulation: With patient/family Time For Goal Achievement: 08/28/16 Potential to Achieve Goals: Good    Frequency Min 3X/week   Barriers to discharge Inaccessible home environment 11 stairs to main level of home.    Co-evaluation               End of Session Equipment Utilized During Treatment: Gait belt Activity Tolerance: Patient limited by fatigue;Patient limited by pain Patient left: in chair;with call bell/phone within reach;with family/visitor present Nurse Communication: Mobility status PT Visit Diagnosis: Unsteadiness on feet (R26.81);Muscle weakness (generalized) (M62.81)         Time: 1610-9604 PT Time Calculation (min) (ACUTE ONLY): 22 min   Charges:   PT Evaluation $PT Eval Moderate Complexity: 1 Procedure     PT G Codes:         Vena Austria 2016-09-10, 12:49 PM Durenda Hurt. Renaldo Fiddler, [facility] Pager [phone]

## 2016-08-22 DIAGNOSIS — K5792 Diverticulitis of intestine, part unspecified, without perforation or abscess without bleeding: Secondary | ICD-10-CM

## 2016-08-22 DIAGNOSIS — R740 Nonspecific elevation of levels of transaminase and lactic acid dehydrogenase [LDH]: Secondary | ICD-10-CM

## 2016-08-22 DIAGNOSIS — I10 Essential (primary) hypertension: Secondary | ICD-10-CM | POA: Diagnosis not present

## 2016-08-22 DIAGNOSIS — J111 Influenza due to unidentified influenza virus with other respiratory manifestations: Secondary | ICD-10-CM | POA: Diagnosis not present

## 2016-08-22 DIAGNOSIS — K5732 Diverticulitis of large intestine without perforation or abscess without bleeding: Secondary | ICD-10-CM | POA: Diagnosis not present

## 2016-08-22 LAB — COMPREHENSIVE METABOLIC PANEL
ALBUMIN: 2.4 g/dL — AB (ref 3.5–5.0)
ALK PHOS: 139 U/L — AB (ref 38–126)
ALT: 71 U/L — ABNORMAL HIGH (ref 14–54)
AST: 32 U/L (ref 15–41)
Anion gap: 7 (ref 5–15)
BILIRUBIN TOTAL: 3.2 mg/dL — AB (ref 0.3–1.2)
BUN: 5 mg/dL — AB (ref 6–20)
CALCIUM: 8.8 mg/dL — AB (ref 8.9–10.3)
CO2: 18 mmol/L — ABNORMAL LOW (ref 22–32)
CREATININE: 0.82 mg/dL (ref 0.44–1.00)
Chloride: 109 mmol/L (ref 101–111)
GFR calc Af Amer: >60 mL/min (ref >60)
GFR calc non Af Amer: >60 mL/min (ref >60)
GLUCOSE: 87 mg/dL (ref 65–99)
Potassium: 3.9 mmol/L (ref 3.5–5.1)
Sodium: 134 mmol/L — ABNORMAL LOW (ref 135–145)
TOTAL PROTEIN: 4.8 g/dL — AB (ref 6.5–8.1)

## 2016-08-22 LAB — HEPATITIS PANEL, ACUTE
HCV Ab: <0.1 s/co ratio (ref 0.0–0.9)
Hep A IgM: NEGATIVE
Hep B C IgM: NEGATIVE
Hepatitis B Surface Ag: NEGATIVE

## 2016-08-22 MED ORDER — CIPROFLOXACIN HCL 500 MG PO TABS
500.0000 mg | ORAL_TABLET | Freq: Two times a day (BID) | ORAL | Status: DC
Start: 2016-08-22 — End: 2016-08-23
  Administered 2016-08-22 – 2016-08-23 (×3): 500 mg via ORAL
  Filled 2016-08-22 (×3): qty 1

## 2016-08-22 MED ORDER — METRONIDAZOLE 500 MG PO TABS
500.0000 mg | ORAL_TABLET | Freq: Three times a day (TID) | ORAL | Status: DC
  Administered 2016-08-22 – 2016-08-23 (×3): 500 mg via ORAL
  Filled 2016-08-22 (×3): qty 1

## 2016-08-22 MED ORDER — RANITIDINE HCL 150 MG/10ML PO SYRP
150.0000 mg | ORAL_SOLUTION | Freq: Two times a day (BID) | ORAL | Status: DC | PRN
  Administered 2016-08-22: 150 mg via ORAL
  Filled 2016-08-22 (×2): qty 10

## 2016-08-22 NOTE — Care Management Note (Addendum)
Case Management Note  Patient Details  Name: Sheila Davenport MRN: [id number] Date of Birth: 1945/10/04  Subjective/Objective:                    Action/Plan:  PCP is DR Eula Listen Orders received referral given to Clydie Braun with [facility]  PT recommending home health PT. Discussed same with patient and her husband at bedside.   Patient already has 3 in 1 and walker at home. Patient would like [facility] and same HHPT as she had before if possible.   MD paged for HHPT order and face to face. Expected Discharge Date:  08/22/16               Expected Discharge Plan:  Home w Home Health Services  In-House Referral:     Discharge planning Services  CM Consult  Post Acute Care Choice:    Choice offered to:  Patient, Spouse  DME Arranged:    DME Agency:     HH Arranged:  PT HH Agency:  [facility]  Status of Service:  In process, will continue to follow  If discussed at Long Length of Stay Meetings, dates discussed:    Additional Comments:  Kingsley Plan, RN 08/22/2016, 12:39 PM

## 2016-08-22 NOTE — Progress Notes (Signed)
[facility] PROGRESS NOTE  Sheila Davenport ZOX:[id number] DOB: 05/04/1946 DOA: 08/19/2016  PCP: No PCP Per Patient  Brief History/Interval Summary: 71 y.o. female with a h/o diverticulitis who presented to [facility] with fever, chills and weakness of abrupt onset 2/18 at 3pm. In the ED she had a fever to 102F, tachycardia, tachypnea, and low-normal BP. She had LLQ abdominal tenderness and CT abd/pelvis showed inflammation around sigmoid diverticuli. Influenza PCR was checked and is negative.   Reason for Visit: Acute diverticulitis  Consultants: None  Procedures: None  Antibiotics: Started on IV Zosyn. Changed to Cipro and Flagyl On 2/21  Subjective/Interval History: Patient states that she is feeling better. Still has some pain in her abdomen. Tolerating her diet better. She did have a few episodes of loose stool yesterday. Wishes to go home.  ROS: Denies any headaches  Objective:  Vital Signs  Vitals:   08/21/16 1314 08/21/16 2018 08/21/16 2322 08/22/16 0424  BP: 131/61 116/60 (!) 146/87 133/77  Pulse: 72 86 100 82  Resp: 18 19 20 19   Temp: 98.4 F (36.9 C) 98.8 F (37.1 C) 98.3 F (36.8 C) 98.1 F (36.7 C)  TempSrc: Oral Oral Oral Oral  SpO2: 93% 95% 94% 95%  Weight:      Height:        Intake/Output Summary (Last 24 hours) at 08/22/16 1305 Last data filed at 08/22/16 1031  Gross per 24 hour  Intake             2225 ml  Output             1200 ml  Net             1025 ml   Filed Weights   08/19/16 1710 08/20/16 0056  Weight: 66.7 kg (147 lb) 66.7 kg (147 lb)    General appearance: alert, cooperative, appears stated age and no distress Resp: clear to auscultation bilaterally Cardio: regular rate and rhythm, S1, S2 normal, no murmur, click, rub or gallop GI: Abdomen is soft. Mild tenderness appreciated in the left lower quadrant without any rebound, rigidity or guarding. No masses or organomegaly. Extremities: extremities normal, atraumatic, no cyanosis  or edema  Lab Results:  Data Reviewed: I have personally reviewed following labs and imaging studies  CBC:  Recent Labs Lab 08/19/16 1830 08/20/16 0407 08/21/16 0430  WBC 8.8 6.9 9.2  NEUTROABS 7.9* 6.0  --   HGB 12.1 10.7* 10.8*  HCT 37.4 33.6* 33.2*  MCV 93.3 93.1 90.5  PLT 200 131* 113*    Basic Metabolic Panel:  Recent Labs Lab 08/19/16 1830 08/20/16 0407 08/21/16 0430 08/22/16 0519  NA 137 137 136 134*  K 3.1* 4.0 3.2* 3.9  CL 107 110 110 109  CO2 23 19* 19* 18*  GLUCOSE 102* 120* 99 87  BUN 17 12 11  5*  CREATININE 0.88 0.94 0.90 0.82  CALCIUM 10.2 8.8* 8.8* 8.8*    GFR: Estimated Creatinine Clearance: 57.7 mL/min (by C-G formula based on SCr of 0.82 mg/dL).  Liver Function Tests:  Recent Labs Lab 08/19/16 1830 08/20/16 0407 08/21/16 0430 08/22/16 0519  AST 23 158* 74* 32  ALT 15 126* 106* 71*  ALKPHOS 64 72 90 139*  BILITOT 0.6 1.0 1.7* 3.2*  PROT 6.1* 4.8* 4.8* 4.8*  ALBUMIN 3.5 2.7* 2.6* 2.4*     Recent Labs Lab 08/19/16 1830  LIPASE 24     Recent Results (from the past 240 hour(s))  MRSA PCR Screening  Status: None   Collection Time: 08/20/16  1:28 AM  Result Value Ref Range Status   MRSA by PCR NEGATIVE NEGATIVE Final    Comment:        The GeneXpert MRSA Assay (FDA approved for NASAL specimens only), is one component of a comprehensive MRSA colonization surveillance program. It is not intended to diagnose MRSA infection nor to guide or monitor treatment for MRSA infections.   C difficile quick scan w PCR reflex     Status: None   Collection Time: 08/21/16  1:59 PM  Result Value Ref Range Status   C Diff antigen NEGATIVE NEGATIVE Final   C Diff toxin NEGATIVE NEGATIVE Final   C Diff interpretation No C. difficile detected.  Final      Radiology Studies: No results found.   Medications:  Scheduled: . ciprofloxacin  500 mg Oral BID  . enoxaparin (LOVENOX) injection  40 mg Subcutaneous Q24H  . metroNIDAZOLE   500 mg Oral Q8H  . sertraline  100 mg Oral Daily  . traZODone  50-100 mg Oral QHS   Continuous:  ZOX:[id number] **OR** acetaminophen, dicyclomine, diphenoxylate-atropine, Gerhardt's butt cream, ondansetron **OR** ondansetron (ZOFRAN) IV  Assessment/Plan:  Principal Problem:   SIRS (systemic inflammatory response syndrome) (HCC) Active Problems:   Fever   Diverticulitis of intestine without bleeding   Acute diverticulitis    Acute sigmoid diverticulitis Some concern for sepsis at the time of admission due to fever, tachycardia and tachypnea. Lactic acid was normal. Influenza PCR was negative. Followed by gastroenterology at [facility]. Seen at [facility] by Dr. Loman Chroman. Had recent colonoscopy 07/20/2016 following diverticulitis bout in January, which showed a tubular adenoma and sigmoid lipoma biopsied. No microscopic colitis seen. Notes seem to indicate suspicion for IBS and she's displayed many of these symptoms improved with bentyl.  patient was started on IV Zosyn. Symptoms have improved. She did have some nausea, vomiting, diarrhea, which appears to be resolving. Okay to change to oral antibiotics today. We will start ciprofloxacin and Flagyl. Patient reports allergy to Levaquin in the form of vomiting, but has tolerated ciprofloxacin before. C. difficile was negative.  Transaminitis Acutely up 2/18 > 2/19, improving. CT showed possible periportal edema but no other indication of hepatic changes. Tetanus panel is negative. Outpatient follow-up.  Hypokalemia Due to ongoing GI losses. Repleted.  History of essential hypertension  Chronic, stable. Holding BP medications with low-to-normal BPs currently.  Insomnia Chronic problem worsened in hospital. Trial trazodone qHS  DVT Prophylaxis: Lovenox    Code Status: Full code  Family Communication: Discussed with the patient and her husband  Disposition Plan: Management as outlined above. Continue to mobilize.  Home health to be ordered at the time of discharge. Anticipate she will go home in the next 24-48 hours.    LOS: 2 days   [facility]  [facility] Pager [phone] 08/22/2016, 1:05 PM  If 7PM-7AM, please contact night-coverage at [email or web address], password [facility]

## 2016-08-23 DIAGNOSIS — J111 Influenza due to unidentified influenza virus with other respiratory manifestations: Secondary | ICD-10-CM | POA: Diagnosis not present

## 2016-08-23 DIAGNOSIS — K5732 Diverticulitis of large intestine without perforation or abscess without bleeding: Secondary | ICD-10-CM | POA: Diagnosis not present

## 2016-08-23 DIAGNOSIS — K5792 Diverticulitis of intestine, part unspecified, without perforation or abscess without bleeding: Secondary | ICD-10-CM | POA: Diagnosis not present

## 2016-08-23 LAB — COMPREHENSIVE METABOLIC PANEL
ALT: 56 U/L — AB (ref 14–54)
ANION GAP: 7 (ref 5–15)
AST: 22 U/L (ref 15–41)
Albumin: 2.3 g/dL — ABNORMAL LOW (ref 3.5–5.0)
Alkaline Phosphatase: 149 U/L — ABNORMAL HIGH (ref 38–126)
BUN: 5 mg/dL — ABNORMAL LOW (ref 6–20)
CHLORIDE: 109 mmol/L (ref 101–111)
CO2: 20 mmol/L — AB (ref 22–32)
CREATININE: 0.76 mg/dL (ref 0.44–1.00)
Calcium: 9.1 mg/dL (ref 8.9–10.3)
Glucose, Bld: 112 mg/dL — ABNORMAL HIGH (ref 65–99)
POTASSIUM: 3.9 mmol/L (ref 3.5–5.1)
SODIUM: 136 mmol/L (ref 135–145)
Total Bilirubin: 3.2 mg/dL — ABNORMAL HIGH (ref 0.3–1.2)
Total Protein: 4.8 g/dL — ABNORMAL LOW (ref 6.5–8.1)

## 2016-08-23 LAB — CBC
HCT: 31.4 % — ABNORMAL LOW (ref 36.0–46.0)
Hemoglobin: 10.4 g/dL — ABNORMAL LOW (ref 12.0–15.0)
MCH: 29.5 pg (ref 26.0–34.0)
MCHC: 33.1 g/dL (ref 30.0–36.0)
MCV: 89 fL (ref 78.0–100.0)
PLATELETS: 121 10*3/uL — AB (ref 150–400)
RBC: 3.53 MIL/uL — AB (ref 3.87–5.11)
RDW: 14.3 % (ref 11.5–15.5)
WBC: 4.5 10*3/uL (ref 4.0–10.5)

## 2016-08-23 MED ORDER — CIPROFLOXACIN HCL 500 MG PO TABS: 500.0000 mg | ORAL_TABLET | Freq: Two times a day (BID) | ORAL | 0 refills | Status: AC

## 2016-08-23 MED ORDER — METRONIDAZOLE 500 MG PO TABS: 500.0000 mg | ORAL_TABLET | Freq: Three times a day (TID) | ORAL | 0 refills | Status: AC

## 2016-08-23 MED ORDER — ONDANSETRON 4 MG PO TBDP: 8.0000 mg | ORAL_TABLET | Freq: Three times a day (TID) | ORAL | 0 refills | Status: AC | PRN

## 2016-08-23 MED ORDER — VALSARTAN 160 MG PO TABS: 160.0000 mg | ORAL_TABLET | Freq: Every day | ORAL | Status: DC

## 2016-08-23 NOTE — Progress Notes (Signed)
Pt is up and ambulating in the room. Abd is distended but soft, a little sore per pt. Tolerating heart diet. No nausea, no vomiting. Anxious to go home. Discharge instructions given to pt, verbalized understanding. Discharged to home accompanied by spouse.

## 2016-08-23 NOTE — Discharge Instructions (Signed)

## 2016-08-23 NOTE — Discharge Summary (Signed)
[facility]  Physician Discharge Summary   Patient ID: Sheila Davenport MRN: [id number] DOB/AGE: October 10, 1945 71 y.o.  Admit date: 08/19/2016 Discharge date: 08/23/2016  DISCHARGE DIAGNOSES:  Principal Problem:   SIRS (systemic inflammatory response syndrome) (HCC) Active Problems:   Fever   Diverticulitis of intestine without bleeding   Acute diverticulitis   RECOMMENDATIONS FOR OUTPATIENT FOLLOW UP: 1. Needs liver function tests in 1 week to ensure that transaminases and bilirubin levels are improving 2. Home health has been ordered  DISCHARGE CONDITION: fair  Diet recommendation: As before  [facility] Weights   08/19/16 1710 08/20/16 0056  Weight: 66.7 kg (147 lb) 66.7 kg (147 lb)    INITIAL HISTORY: 71 y.o.femalewith a h/o diverticulitis [facility] fever, chills and weakness of abrupt onset 2/18 at 3pm. In the ED she had a fever to 102F, tachycardia, tachypnea, and low-normal BP. She had LLQ abdominal tenderness and CT abd/pelvis showed inflammation around sigmoid diverticuli. Influenza PCR was checked and is negative.    HOSPITAL COURSE:   Acute sigmoid diverticulitis Some concern for sepsis at the time of admission due to fever, tachycardia and tachypnea. Lactic acid was normal. Influenza PCR was negative. Followed by gastroenterology at [facility]. Seen at [facility] by Dr. Loman Chroman. Had recent colonoscopy 07/20/2016 following diverticulitis bout in January, which showeda tubular adenoma and sigmoid lipoma biopsied. No microscopic colitis seen. Notes seem to indicate suspicion for IBS and she's displayed many of these symptoms improved with bentyl.Patient was initially started on IV Zosyn. Symptoms have improved. She did have some nausea, vomiting, diarrhea, which appears to be resolving. She was changed over to ciprofloxacin and Flagyl. She has tolerated these antibiotics. Symptomatically, she has improved. Diarrhea has subsided. Okay for  discharge home today. Patient very keen on going home.  Transaminitis with hyperbilirubinemia Transaminases noted to be acutely elevated. Hepatitis panel was negative. CT scan did not show any acute findings. She is status post cholecystectomy. No dilatation of the biliary ducts were noted. Elevation in her liver function tests could be due to acute illness. She will need repeat testing in 1 week time.  Hypokalemia Due to ongoing GI losses. Repleted.  History of essential hypertension  Blood pressure medications were initially held due to borderline low blood pressures. Pressures are now in hypertensive range. Resume calcium channel blocker as well as beta blocker. Resume ARB in a few days.  Overall improved. Discussed her blood work with her in detail. Patient excited about going home. Okay for discharge.   PERTINENT LABS:  The results of significant diagnostics from this hospitalization (including imaging, microbiology, ancillary and laboratory) are listed below for reference.    Microbiology: Recent Results (from the past 240 hour(s))  MRSA PCR Screening     Status: None   Collection Time: 08/20/16  1:28 AM  Result Value Ref Range Status   MRSA by PCR NEGATIVE NEGATIVE Final    Comment:        The GeneXpert MRSA Assay (FDA approved for NASAL specimens only), is one component of a comprehensive MRSA colonization surveillance program. It is not intended to diagnose MRSA infection nor to guide or monitor treatment for MRSA infections.   C difficile quick scan w PCR reflex     Status: None   Collection Time: 08/21/16  1:59 PM  Result Value Ref Range Status   C Diff antigen NEGATIVE NEGATIVE Final   C Diff toxin NEGATIVE NEGATIVE Final   C Diff interpretation No C. difficile detected.  Final     Labs: Basic Metabolic Panel:  Recent Labs Lab 08/19/16 1830 08/20/16 0407 08/21/16 0430 08/22/16 0519 08/23/16 0439  NA 137 137 136 134* 136  K 3.1* 4.0 3.2* 3.9 3.9    CL 107 110 110 109 109  CO2 23 19* 19* 18* 20*  GLUCOSE 102* 120* 99 87 112*  BUN 17 12 11  5* 5*  CREATININE 0.88 0.94 0.90 0.82 0.76  CALCIUM 10.2 8.8* 8.8* 8.8* 9.1   Liver Function Tests:  Recent Labs Lab 08/19/16 1830 08/20/16 0407 08/21/16 0430 08/22/16 0519 08/23/16 0439  AST 23 158* 74* 32 22  ALT 15 126* 106* 71* 56*  ALKPHOS 64 72 90 139* 149*  BILITOT 0.6 1.0 1.7* 3.2* 3.2*  PROT 6.1* 4.8* 4.8* 4.8* 4.8*  ALBUMIN 3.5 2.7* 2.6* 2.4* 2.3*    Recent Labs Lab 08/19/16 1830  LIPASE 24   CBC:  Recent Labs Lab 08/19/16 1830 08/20/16 0407 08/21/16 0430 08/23/16 0439  WBC 8.8 6.9 9.2 4.5  NEUTROABS 7.9* 6.0  --   --   HGB 12.1 10.7* 10.8* 10.4*  HCT 37.4 33.6* 33.2* 31.4*  MCV 93.3 93.1 90.5 89.0  PLT 200 131* 113* 121*     IMAGING STUDIES Dg Chest 2 View  Result Date: 08/19/2016 CLINICAL DATA:  Fever and generalized body aches. EXAM: CHEST  2 VIEW COMPARISON:  12/05/2015 FINDINGS: Cardiopericardial silhouette is at upper limits of normal for size. The lungs are clear wiithout focal pneumonia, edema, pneumothorax or pleural effusion. Interstitial markings are diffusely coarsened with chronic features. Old right posterior rib fracture again noted. IMPRESSION: No active cardiopulmonary disease. Electronically Signed   By: Kennith Center M.D.   On: 08/19/2016 20:13   Ct Abdomen Pelvis W Contrast  Result Date: 08/19/2016 CLINICAL DATA:  Fever with generalized body aches and nausea. One-day history of abdominal pain. EXAM: CT ABDOMEN AND PELVIS WITH CONTRAST TECHNIQUE: Multidetector CT imaging of the abdomen and pelvis was performed using the standard protocol following bolus administration of intravenous contrast. CONTRAST:  ISOVUE-300 IOPAMIDOL (ISOVUE-300) INJECTION 61% COMPARISON:  06/19/2016 FINDINGS: Lower chest:  Basilar atelectasis. Hepatobiliary: No focal abnormality of the liver parenchyma. Question mild periportal edema. Gallbladder surgically  absent. No intrahepatic or extrahepatic biliary dilation. Pancreas: No focal mass lesion. No dilatation of the main duct. No intraparenchymal cyst. No peripancreatic edema. Spleen: No splenomegaly. No focal mass lesion. Adrenals/Urinary Tract: No adrenal nodule or mass. 5.2 cm cyst upper pole right kidney, stable. 4 mm nonobstructing stone identified lower pole left kidney No evidence for hydroureter. The urinary bladder appears normal for the degree of distention. Stomach/Bowel: Stomach is nondistended. No gastric wall thickening. No evidence of outlet obstruction. Duodenum is normally positioned as is the ligament of Treitz. No small bowel wall thickening. No small bowel dilatation. The terminal ileum is normal. The appendix is not visualized, but there is no edema or inflammation in the region of the cecum. Advanced diverticular changes noted sigmoid colon with some subtle pericolonic edema/ inflammation and ill definition of colonic wall in the proximal sigmoid segment. There may be a small amount of fluid in the sigmoid mesocolon is scattered small lymph nodes are seen in the mesocolon, as before. Vascular/Lymphatic: There is abdominal aortic atherosclerosis without aneurysm. No gastrohepatic ligament lymphadenopathy. Borderline hepatoduodenal ligament lymphadenopathy is stable. No retroperitoneal lymphadenopathy. No pelvic sidewall lymphadenopathy. Reproductive: The uterus has normal CT imaging appearance. There is no adnexal mass. Other: No intraperitoneal free fluid. Musculoskeletal: Bone windows reveal no  worrisome lytic or sclerotic osseous lesions. IMPRESSION: 1. Advanced diverticular disease sigmoid colon. Subtle area of apparent wall edema and pericolonic edema/inflammation suggests diverticulitis. No evidence for perforation or abscess. As colorectal neoplasm can present with similar imaging features, close follow-up suggested. 2. Stable simple appearing cyst upper pole right kidney. 3. Nonobstructing  stone lower pole left kidney. 4.  Abdominal Aortic Atherosclerois (ICD10-170.0) Electronically Signed   By: Kennith Center M.D.   On: 08/19/2016 20:27    DISCHARGE EXAMINATION: Vitals:   08/22/16 2039 08/22/16 2050 08/23/16 0504 08/23/16 1000  BP:  124/60 140/60 111/68  Pulse:  76 76 81  Resp:  19 19 (!) 26  Temp: 98.6 F (37 C) 98.1 F (36.7 C) 98.1 F (36.7 C) 98.1 F (36.7 C)  TempSrc: Oral Oral Oral Oral  SpO2:  96% 96% 95%  Weight:      Height:       General appearance: alert, cooperative, appears stated age and no distress Resp: clear to auscultation bilaterally Cardio: regular rate and rhythm, S1, S2 normal, no murmur, click, rub or gallop GI: soft, non-tender; bowel sounds normal; no masses,  no organomegaly Extremities: extremities normal, atraumatic, no cyanosis or edema  DISPOSITION: Home with home health  Discharge Instructions    Call MD for:  difficulty breathing, headache or visual disturbances    Complete by:  As directed    Call MD for:  extreme fatigue    Complete by:  As directed    Call MD for:  persistant dizziness or light-headedness    Complete by:  As directed    Call MD for:  persistant nausea and vomiting    Complete by:  As directed    Call MD for:  severe uncontrolled pain    Complete by:  As directed    Call MD for:  temperature >100.4    Complete by:  As directed    Diet - low sodium heart healthy    Complete by:  As directed    Discharge instructions    Complete by:  As directed    Please be sure to follow-up with your primary care provider or your gastroenterologist within a week. You will need to have blood work done to check your liver function tests. Please see instructions regarding blood pressure medications.  You were cared for by a hospitalist during your hospital stay. If you have any questions about your discharge medications or the care you received while you were in the hospital after you are discharged, you can call the unit and  asked to speak with the hospitalist on call if the hospitalist that took care of you is not available. Once you are discharged, your primary care physician will handle any further medical issues. Please note that NO REFILLS for any discharge medications will be authorized once you are discharged, as it is imperative that you return to your primary care physician (or establish a relationship with a primary care physician if you do not have one) for your aftercare needs so that they can reassess your need for medications and monitor your lab values. If you do not have a primary care physician, you can call [phone] for a physician referral.   Increase activity slowly    Complete by:  As directed       ALLERGIES:  Allergies  Allergen Reactions  . Levofloxacin Other (See Comments)    Unknown  . Nitrofuran Derivatives Other (See Comments)    Unknown  . Phenazopyridine Other (  See Comments)    Unknown  . Decongest-Aid [Pseudoephedrine] Palpitations  . Other Palpitations and Other (See Comments)    Antihistamine; Reactions: Tachycardia Decongestant; Reactions: Tachycardia     Discharge Medication List as of 08/23/2016 11:45 AM    START taking these medications   Details  ciprofloxacin (CIPRO) 500 MG tablet Take 1 tablet (500 mg total) by mouth 2 (two) times daily. FOR 10 DAYS, Starting Thu 08/23/2016, Until Sun 09/02/2016, Normal    metroNIDAZOLE (FLAGYL) 500 MG tablet Take 1 tablet (500 mg total) by mouth every 8 (eight) hours., Starting Thu 08/23/2016, Until Sun 09/02/2016, Normal      CONTINUE these medications which have CHANGED   Details  ondansetron (ZOFRAN-ODT) 4 MG disintegrating tablet Take 2 tablets (8 mg total) by mouth every 8 (eight) hours as needed for nausea or vomiting., Starting Thu 08/23/2016, Normal    valsartan (DIOVAN) 160 MG tablet Take 1 tablet (160 mg total) by mouth daily. RESUME AFTER 3 DAYS, Starting Thu 08/23/2016, No Print      CONTINUE these medications which have  NOT CHANGED   Details  amLODipine (NORVASC) 2.5 MG tablet Take 2.5 mg by mouth daily., Starting Tue 07/24/2016, Historical Med    Ascorbic Acid (VITAMIN C PO) Take 1 tablet by mouth daily., Historical Med    aspirin 81 MG tablet Take 81 mg by mouth daily., Historical Med    atenolol (TENORMIN) 50 MG tablet Take 50 mg by mouth daily., Historical Med    calcium-vitamin D (OSCAL WITH D) 500-200 MG-UNIT tablet Take 1 tablet by mouth daily. , Historical Med    Cholecalciferol (VITAMIN D PO) Take 1 capsule by mouth daily., Historical Med    CRANBERRY PO Take 2 capsules by mouth 2 (two) times daily. , Historical Med    dicyclomine (BENTYL) 20 MG tablet Take 20 mg by mouth 3 (three) times daily as needed for spasms. , Starting Fri 07/27/2016, Historical Med    diphenoxylate-atropine (LOMOTIL) 2.5-0.025 MG tablet Take 1 tablet by mouth 3 (three) times daily as needed for diarrhea or loose stools. , Starting Wed 05/23/2016, Historical Med    ELDERBERRY PO Take 1 capsule by mouth daily., Historical Med    meclizine (ANTIVERT) 25 MG tablet Take 25 mg by mouth 3 (three) times daily as needed for dizziness., Historical Med    Multiple Vitamins-Minerals (MULTIVITAMIN WITH MINERALS) tablet Take 1 tablet by mouth daily., Historical Med    Omega-3 Fatty Acids (FISH OIL PO) Take 4 capsules by mouth daily., Historical Med    Probiotic Product (PROBIOTIC PO) Take 1 capsule by mouth daily., Historical Med    ranitidine (ZANTAC) 150 MG tablet Take 150 mg by mouth 3 (three) times daily as needed for heartburn. , Historical Med    sertraline (ZOLOFT) 100 MG tablet Take 100 mg by mouth daily. , Historical Med      STOP taking these medications     azithromycin (ZITHROMAX) 250 MG tablet      ondansetron (ZOFRAN) 4 MG tablet          Follow-up Information    RHOTON,ALBERT J, MD. Schedule an appointment as soon as possible for a visit in 1 week(s).   Specialty:  Gastroenterology Contact  information: [address] [place] [address] [phone]           TOTAL DISCHARGE TIME: 35 mins  [facility]  [facility] Pager [phone]  08/23/2016, 1:59 PM

## 2016-08-23 NOTE — Progress Notes (Signed)
Physical Therapy Treatment Patient Details Name: Sheila Davenport MRN: [id number] DOB: 1946/03/17 Today's Date: 08/23/2016    History of Present Illness Patient is a 71 yo female admitted 08/19/16 with LLQ pain, fever, tachycardia, tachypnea, hypotension.  Patient with SIRS/sepsis due to signoid diverticulitis.    PMH:  HTN, diverticulitis, osteoporosis, Rt TKA    PT Comments    Pt admitted with above diagnosis. Pt currently with functional limitations due to balance and endurance deficits. Pt was able to ambulate with unsteady gait needing min guard to min assist.  Will benefit from continued HHPT as pt going home today.   Pt will benefit from skilled PT to increase their independence and safety with mobility to allow discharge to the venue listed below.     Follow Up Recommendations  Home health PT;Supervision for mobility/OOB     Equipment Recommendations  None recommended by PT    Recommendations for Other Services       Precautions / Restrictions Precautions Precautions: Fall Restrictions Weight Bearing Restrictions: No    Mobility  Bed Mobility               General bed mobility comments: Patient in recliner as PT entered room  Transfers Overall transfer level: Needs assistance Equipment used: Rolling walker (2 wheeled) Transfers: Sit to/from Stand Sit to Stand: Min guard         General transfer comment: Verbal cues for hand placement.   Assist to steady during transfers.  Ambulation/Gait Ambulation/Gait assistance: Min assist;Min guard Ambulation Distance (Feet): 150 Feet Assistive device: Rolling walker (2 wheeled) Gait Pattern/deviations: Step-through pattern;Decreased stride length;Shuffle;Drifts right/left;Trunk flexed Gait velocity: decreased Gait velocity interpretation: Below normal speed for age/gender General Gait Details: Patient with slow, unsteady gait.  Patient with staggering to both sides without device.  Assist to steady during gait.   Husband demonstrates he can assist pt.  Tried to get pt to practice steps but pt and husband declinedd.     Stairs            Wheelchair Mobility    Modified Rankin (Stroke Patients Only)       Balance Overall balance assessment: Needs assistance         Standing balance support: Bilateral upper extremity supported;During functional activity Standing balance-Leahy Scale: Poor Standing balance comment: Relies on UE support             High level balance activites: Direction changes;Turns;Sudden stops High Level Balance Comments: Min assist with challenges with RW.      Cognition Arousal/Alertness: Awake/alert Behavior During Therapy: WFL for tasks assessed/performed Overall Cognitive Status: Within Functional Limits for tasks assessed                      Exercises      General Comments        Pertinent Vitals/Pain Pain Assessment: Faces Faces Pain Scale: Hurts a little bit Pain Location: abdomen Pain Descriptors / Indicators: Aching;Cramping Pain Intervention(s): Limited activity within patient's tolerance;Monitored during session;Repositioned;Premedicated before session  VSS  Home Living                      Prior Function            PT Goals (current goals can now be found in the care plan section) Acute Rehab PT Goals Patient Stated Goal: To feel better Progress towards PT goals: Progressing toward goals    Frequency    Min 3X/week  PT Plan Current plan remains appropriate    Co-evaluation             End of Session Equipment Utilized During Treatment: Gait belt Activity Tolerance: Patient limited by fatigue Patient left: in chair;with call bell/phone within reach;with family/visitor present Nurse Communication: Mobility status PT Visit Diagnosis: Unsteadiness on feet (R26.81);Muscle weakness (generalized) (M62.81)     Time: 4098-1191 PT Time Calculation (min) (ACUTE ONLY): 11 min  Charges:  $Gait  Training: 8-22 mins                    G Codes:       Sheila Davenport Sheila Davenport Sep 17, 2016, 12:01 PM Sheila Davenport,PT Acute Rehabilitation [phone] (pager)

## 2019-08-01 ENCOUNTER — Ambulatory Visit: Payer: Medicare HMO

## 2019-08-06 ENCOUNTER — Ambulatory Visit: Payer: Medicare HMO | Attending: Internal Medicine

## 2019-08-06 ENCOUNTER — Ambulatory Visit: Payer: Medicare HMO

## 2019-08-06 DIAGNOSIS — Z23 Encounter for immunization: Secondary | ICD-10-CM | POA: Insufficient documentation

## 2019-08-06 NOTE — Progress Notes (Signed)
   Covid-19 Vaccination Clinic  Name:  Danisha Brassfield    MRN: [id number] DOB: 05-17-1946  08/06/2019  Ms. Cleary was observed post Covid-19 immunization for 30 minutes based on pre-vaccination screening without incidence. She was provided with Vaccine Information Sheet and instruction to access the V-Safe system.   Ms. Fryman was instructed to call 911 with any severe reactions post vaccine: Marland Kitchen Difficulty breathing  . Swelling of your face and throat  . A fast heartbeat  . A bad rash all over your body  . Dizziness and weakness    Immunizations Administered    Name Date Dose VIS Date Route   Pfizer COVID-19 Vaccine 08/06/2019  9:33 AM 0.3 mL 06/12/2019 Intramuscular   Manufacturer: [employer], [employer]   Lot: [id number]   NDC: [address]-2

## 2019-08-31 ENCOUNTER — Other Ambulatory Visit: Payer: Self-pay

## 2019-08-31 ENCOUNTER — Ambulatory Visit: Payer: Medicare HMO | Attending: Internal Medicine

## 2019-08-31 DIAGNOSIS — Z23 Encounter for immunization: Secondary | ICD-10-CM

## 2019-08-31 NOTE — Progress Notes (Signed)
   Covid-19 Vaccination Clinic  Name:  Camreigh Michie    MRN: [id number] DOB: 12-11-1945  08/31/2019  Ms. Sabado was observed post Covid-19 immunization for 15 minutes without incidence. She was provided with Vaccine Information Sheet and instruction to access the V-Safe system.   Ms. Wangerin was instructed to call 911 with any severe reactions post vaccine: Marland Kitchen Difficulty breathing  . Swelling of your face and throat  . A fast heartbeat  . A bad rash all over your body  . Dizziness and weakness    Immunizations Administered    Name Date Dose VIS Date Route   Pfizer COVID-19 Vaccine 08/31/2019 11:04 AM 0.3 mL 06/12/2019 Intramuscular   Manufacturer: [employer], [employer]   Lot: [id number]   NDC: [address]-7

## 2020-01-25 ENCOUNTER — Encounter (HOSPITAL_BASED_OUTPATIENT_CLINIC_OR_DEPARTMENT_OTHER): Payer: Self-pay | Admitting: *Deleted

## 2020-01-25 ENCOUNTER — Emergency Department (HOSPITAL_BASED_OUTPATIENT_CLINIC_OR_DEPARTMENT_OTHER): Payer: Medicare HMO

## 2020-01-25 ENCOUNTER — Other Ambulatory Visit: Payer: Self-pay

## 2020-01-25 ENCOUNTER — Emergency Department (HOSPITAL_BASED_OUTPATIENT_CLINIC_OR_DEPARTMENT_OTHER)
Admission: EM | Admit: 2020-01-25 | Discharge: 2020-01-25 | Disposition: A | Discharge status: Home / Self Care | Payer: Medicare HMO | Attending: Emergency Medicine | Admitting: Emergency Medicine

## 2020-01-25 DIAGNOSIS — Z7982 Long term (current) use of aspirin: Secondary | ICD-10-CM | POA: Diagnosis not present

## 2020-01-25 DIAGNOSIS — E785 Hyperlipidemia, unspecified: Secondary | ICD-10-CM | POA: Insufficient documentation

## 2020-01-25 DIAGNOSIS — F17200 Nicotine dependence, unspecified, uncomplicated: Secondary | ICD-10-CM | POA: Insufficient documentation

## 2020-01-25 DIAGNOSIS — R1110 Vomiting, unspecified: Secondary | ICD-10-CM | POA: Insufficient documentation

## 2020-01-25 DIAGNOSIS — R197 Diarrhea, unspecified: Secondary | ICD-10-CM | POA: Diagnosis not present

## 2020-01-25 DIAGNOSIS — I10 Essential (primary) hypertension: Secondary | ICD-10-CM | POA: Diagnosis not present

## 2020-01-25 DIAGNOSIS — R410 Disorientation, unspecified: Secondary | ICD-10-CM | POA: Insufficient documentation

## 2020-01-25 DIAGNOSIS — K5732 Diverticulitis of large intestine without perforation or abscess without bleeding: Secondary | ICD-10-CM | POA: Insufficient documentation

## 2020-01-25 DIAGNOSIS — K219 Gastro-esophageal reflux disease without esophagitis: Secondary | ICD-10-CM | POA: Insufficient documentation

## 2020-01-25 DIAGNOSIS — M810 Age-related osteoporosis without current pathological fracture: Secondary | ICD-10-CM | POA: Diagnosis not present

## 2020-01-25 DIAGNOSIS — Z96651 Presence of right artificial knee joint: Secondary | ICD-10-CM | POA: Insufficient documentation

## 2020-01-25 DIAGNOSIS — T50905A Adverse effect of unspecified drugs, medicaments and biological substances, initial encounter: Secondary | ICD-10-CM

## 2020-01-25 DIAGNOSIS — R509 Fever, unspecified: Secondary | ICD-10-CM

## 2020-01-25 LAB — CBC WITH DIFFERENTIAL/PLATELET
Abs Immature Granulocytes: 0.03 10*3/uL (ref 0.00–0.07)
Basophils Absolute: 0 10*3/uL (ref 0.0–0.1)
Basophils Relative: 0 %
Eosinophils Absolute: 0.2 10*3/uL (ref 0.0–0.5)
Eosinophils Relative: 2 %
HCT: 45.6 % (ref 36.0–46.0)
Hemoglobin: 14.5 g/dL (ref 12.0–15.0)
Immature Granulocytes: 0 %
Lymphocytes Relative: 16 %
Lymphs Abs: 1.5 10*3/uL (ref 0.7–4.0)
MCH: 31 pg (ref 26.0–34.0)
MCHC: 31.8 g/dL (ref 30.0–36.0)
MCV: 97.6 fL (ref 80.0–100.0)
Monocytes Absolute: 0.5 10*3/uL (ref 0.1–1.0)
Monocytes Relative: 6 %
Neutro Abs: 6.9 10*3/uL (ref 1.7–7.7)
Neutrophils Relative %: 76 %
Platelets: 225 10*3/uL (ref 150–400)
RBC: 4.67 MIL/uL (ref 3.87–5.11)
RDW: 14 % (ref 11.5–15.5)
WBC: 9.2 10*3/uL (ref 4.0–10.5)
nRBC: 0 % (ref 0.0–0.2)

## 2020-01-25 LAB — URINALYSIS, MICROSCOPIC (REFLEX): WBC, UA: NONE SEEN WBC/hpf (ref 0–5)

## 2020-01-25 LAB — COMPREHENSIVE METABOLIC PANEL
ALT: 22 U/L (ref 0–44)
AST: 21 U/L (ref 15–41)
Albumin: 4.1 g/dL (ref 3.5–5.0)
Alkaline Phosphatase: 50 U/L (ref 38–126)
Anion gap: 11 (ref 5–15)
BUN: 14 mg/dL (ref 8–23)
CO2: 26 mmol/L (ref 22–32)
Calcium: 9.7 mg/dL (ref 8.9–10.3)
Chloride: 98 mmol/L (ref 98–111)
Creatinine, Ser: 1 mg/dL (ref 0.44–1.00)
GFR calc Af Amer: >60 mL/min (ref >60)
GFR calc non Af Amer: 55 mL/min — ABNORMAL LOW (ref >60)
Glucose, Bld: 132 mg/dL — ABNORMAL HIGH (ref 70–99)
Potassium: 3.3 mmol/L — ABNORMAL LOW (ref 3.5–5.1)
Sodium: 135 mmol/L (ref 135–145)
Total Bilirubin: 0.8 mg/dL (ref 0.3–1.2)
Total Protein: 6.8 g/dL (ref 6.5–8.1)

## 2020-01-25 LAB — URINALYSIS, ROUTINE W REFLEX MICROSCOPIC
Bilirubin Urine: NEGATIVE
Glucose, UA: NEGATIVE mg/dL
Ketones, ur: NEGATIVE mg/dL
Leukocytes,Ua: NEGATIVE
Nitrite: NEGATIVE
Protein, ur: NEGATIVE mg/dL
Specific Gravity, Urine: <1.005 — ABNORMAL LOW (ref 1.005–1.030)
pH: 6.5 (ref 5.0–8.0)

## 2020-01-25 LAB — LACTIC ACID, PLASMA: Lactic Acid, Venous: 1.6 mmol/L (ref 0.5–1.9)

## 2020-01-25 MED ORDER — SODIUM CHLORIDE 0.9% FLUSH
3.0000 mL | Freq: Once | INTRAVENOUS | Status: DC
  Filled 2020-01-25: qty 3

## 2020-01-25 MED ORDER — ONDANSETRON 4 MG PO TBDP: 4.0000 mg | ORAL_TABLET | ORAL | 0 refills | Status: AC | PRN

## 2020-01-25 NOTE — ED Notes (Signed)
Per husband Pt was vomiting yellow liquid yesterday and running fever

## 2020-01-25 NOTE — ED Triage Notes (Signed)
Fever, vomiting, diarrhea and chills since yesterday. States she is taking medication for Crohns.

## 2020-01-25 NOTE — Discharge Instructions (Addendum)
1.  It appears likely that your episode of illness was associated with azathioprine use.  Your symptoms seem to be resolved and your lab work and vital signs are normal now. 2.  Follow-up with your gastroenterologist as soon as possible. 3.  Return to the emergency department if you develop recurrence of fever or confusion, abdominal pain, return of vomiting or diarrhea or other concerning symptoms.

## 2020-01-25 NOTE — ED Provider Notes (Signed)
[facility] EMERGENCY DEPARTMENT Provider Note   CSN: [id number] Arrival date & time: 01/25/20  1646     History Chief Complaint  Patient presents with  . Fever    Sheila Davenport is a 74 y.o. female.  HPI Patient reports she has a history of Crohn's disease.  She reports she has not felt well for about 4 days.  She has had intermittent episodes of vomiting and diarrhea.  She reports Saturday she was having both.  She had fever starting last night.  She reports she was confused overnight.  She can remember that she had vomiting and that her husband checked her temperature.  She reports she slept a lot and does not remember much of what happened.  She reports she has been started back on azathioprine just before the symptoms started.  She reports something similar happened over a month ago when she was on azathioprine and they stopped it for a while.  Symptoms were improved.  She reports her GI doctor thought it was probably a "GI bug".  Thus, she was started back on the azathioprine.  She reports she has been throwing up yellow material overnight and had a fever as high as 102.  She reports she feels generally weak.      Past Medical History:  Diagnosis Date  . Diverticulosis   . Dyslipidemia   . GERD (gastroesophageal reflux disease)   . Hypertension   . Osteoporosis     Patient Active Problem List   Diagnosis Date Noted  . SIRS (systemic inflammatory response syndrome) (HCC) 08/20/2016  . Diverticulitis of intestine without bleeding 08/20/2016  . Acute diverticulitis 08/20/2016  . Fever 08/19/2016    Past Surgical History:  Procedure Laterality Date  . HAND SURGERY Right   . NASAL SINUS SURGERY    . REPLACEMENT TOTAL KNEE Right      OB History   No obstetric history on file.     Family History  Problem Relation Age of Onset  . Hypertension Other     Social History   Tobacco Use  . Smoking status: Current Every Day Smoker  . Smokeless tobacco: Never  Used  Substance Use Topics  . Alcohol use: No  . Drug use: No    Home Medications Prior to Admission medications   Medication Sig Start Date End Date Taking? Authorizing Provider  amLODipine (NORVASC) 2.5 MG tablet Take 2.5 mg by mouth daily. 07/24/16  Yes [provider]  Ascorbic Acid (VITAMIN C PO) Take 1 tablet by mouth daily.   Yes [provider]  aspirin 81 MG tablet Take 81 mg by mouth daily.   Yes [provider]  atenolol (TENORMIN) 50 MG tablet Take 50 mg by mouth daily.   Yes [provider]  azaTHIOprine (IMURAN) 50 MG tablet Take by mouth.   Yes [provider]  budesonide (ENTOCORT EC) 3 MG 24 hr capsule Take by mouth. 12/18/19 03/17/20 Yes [provider]  calcium-vitamin D (OSCAL WITH D) 500-200 MG-UNIT tablet Take 1 tablet by mouth daily.    Yes [provider]  Cholecalciferol (VITAMIN D PO) Take 1 capsule by mouth daily.   Yes [provider]  CRANBERRY PO Take 2 capsules by mouth 2 (two) times daily.    Yes [provider]  dicyclomine (BENTYL) 20 MG tablet Take 20 mg by mouth 3 (three) times daily as needed for spasms.  07/27/16  Yes [provider]  diphenoxylate-atropine (LOMOTIL) 2.5-0.025 MG tablet  Take 1 tablet by mouth 3 (three) times daily as needed for diarrhea or loose stools.  05/23/16  Yes [provider]  ELDERBERRY PO Take 1 capsule by mouth daily.   Yes [provider]  meclizine (ANTIVERT) 25 MG tablet Take 25 mg by mouth 3 (three) times daily as needed for dizziness.   Yes [provider]  Multiple Vitamins-Minerals (MULTIVITAMIN WITH MINERALS) tablet Take 1 tablet by mouth daily.   Yes [provider]  Omega-3 Fatty Acids (FISH OIL PO) Take 4 capsules by mouth daily.   Yes [provider]  pantoprazole (PROTONIX) 40 MG tablet Take 1 tablet by mouth 2 (two) times daily. 10/28/19  Yes [provider]  Probiotic Product  (PROBIOTIC PO) Take 1 capsule by mouth daily.   Yes [provider]  rosuvastatin (CRESTOR) 10 MG tablet TAKE 1 TABLET(10 MG) BY MOUTH DAILY 11/18/19  Yes [provider]  sertraline (ZOLOFT) 100 MG tablet Take 100 mg by mouth daily.    Yes [provider]  solifenacin (VESICARE) 10 MG tablet Take by mouth. 09/03/19  Yes [provider]  telmisartan (MICARDIS) 40 MG tablet Take by mouth. 10/19/19  Yes [provider]  ondansetron (ZOFRAN ODT) 4 MG disintegrating tablet Take 1 tablet (4 mg total) by mouth every 4 (four) hours as needed for nausea or vomiting. 01/25/20   Arby Barrette, MD  ondansetron (ZOFRAN-ODT) 4 MG disintegrating tablet Take 2 tablets (8 mg total) by mouth every 8 (eight) hours as needed for nausea or vomiting. 08/23/16   Osvaldo Shipper, MD  ranitidine (ZANTAC) 150 MG tablet Take 150 mg by mouth 3 (three) times daily as needed for heartburn.     [provider]  valsartan (DIOVAN) 160 MG tablet Take 1 tablet (160 mg total) by mouth daily. RESUME AFTER 3 DAYS 08/23/16   Osvaldo Shipper, MD    Allergies    Levofloxacin, Nitrofuran derivatives, Phenazopyridine, Decongest-aid [pseudoephedrine], and Other  Review of Systems   Review of Systems 10 systems reviewed and negative except as per HPI Physical Exam Updated Vital Signs BP 120/75 (BP Location: Right Arm)   Pulse 59   Temp 98.5 F (36.9 C) (Oral)   Resp 19   Ht 5\' 3"  (1.6 m)   Wt 72.6 kg   SpO2 97%   BMI 28.34 kg/m   Physical Exam Constitutional:      Comments: Clinically well in appearance.  Nontoxic and alert.  No respiratory distress.  HENT:     Head: Normocephalic and atraumatic.  Eyes:     Extraocular Movements: Extraocular movements intact.     Conjunctiva/sclera: Conjunctivae normal.  Cardiovascular:     Rate and Rhythm: Normal rate and regular rhythm.  Pulmonary:     Effort: Pulmonary effort is normal.     Breath sounds: Normal breath sounds.    Abdominal:     Comments: Abdomen soft.  No guarding.  Patient diffusely endorses tenderness to palpation.  Musculoskeletal:        General: No swelling or tenderness. Normal range of motion.     Cervical back: Neck supple.     Right lower leg: No edema.     Left lower leg: No edema.     Comments: Many spider varicosities of the ankles and feet.  No edema.  Skin condition is otherwise very good.  Calf soft and nontender.  Skin:    General: Skin is warm and dry.  Neurological:     General: No  focal deficit present.     Mental Status: She is oriented to person, place, and time.     Coordination: Coordination normal.  Psychiatric:        Mood and Affect: Mood normal.     ED Results / Procedures / Treatments   Labs (all labs ordered are listed, but only abnormal results are displayed) Labs Reviewed  COMPREHENSIVE METABOLIC PANEL - Abnormal; Notable for the following components:      Result Value   Potassium 3.3 (*)    Glucose, Bld 132 (*)    GFR calc non Af Amer 55 (*)    All other components within normal limits  URINALYSIS, ROUTINE W REFLEX MICROSCOPIC - Abnormal; Notable for the following components:   Specific Gravity, Urine <1.005 (*)    Hgb urine dipstick SMALL (*)    All other components within normal limits  URINALYSIS, MICROSCOPIC (REFLEX) - Abnormal; Notable for the following components:   Bacteria, UA RARE (*)    All other components within normal limits  LACTIC ACID, PLASMA  CBC WITH DIFFERENTIAL/PLATELET  LACTIC ACID, PLASMA    EKG None  Radiology DG Chest 2 View  Result Date: 01/25/2020 CLINICAL DATA:  Fever chills vomiting diarrhea EXAM: CHEST - 2 VIEW COMPARISON:  01/20/2020 FINDINGS: Minimal atelectasis at the right CP angle. Old right seventh rib fracture. No consolidation or effusion. Normal heart size. Aortic atherosclerosis. No pneumothorax. IMPRESSION: No active cardiopulmonary disease. Electronically Signed   By: Jasmine Pang M.D.   On: 01/25/2020  18:06    Procedures Procedures (including critical care time)  Medications Ordered in ED Medications  sodium chloride flush (NS) 0.9 % injection 3 mL (3 mLs Intravenous Not Given 01/25/20 1930)    ED Course  I have reviewed the triage vital signs and the nursing notes.  Pertinent labs & imaging results that were available during my care of the patient were reviewed by me and considered in my medical decision making (see chart for details).    MDM Rules/Calculators/A&P                         Patient has symptoms outlined above.  She had fever that she documented high as 102 and felt periods of confusion and sleeping a lot.  She and her husband associate this with the azathioprine because something similar happened over a month ago and then resolved when she stopped the medication.  At the time of her emergency department visit, patient appears dramatically improved.  She is alert and nontoxic.  Physical exam is normal.  Vital signs are normal.  Patient is not orthostatic.  Lab work does not show any indication of infectious source, metabolic source or dehydration.  At this time, I do feel patient is stable for discharge.  She will not resume the azathioprine and discuss this with her doctor.  Careful return precautions are reviewed for any fever or recrudescence of symptoms.  Final Clinical Impression(s) / ED Diagnoses Final diagnoses:  Vomiting and diarrhea  Confusion  Fever, unspecified fever cause  Adverse effect of drug, initial encounter    Rx / DC Orders ED Discharge Orders         Ordered    ondansetron (ZOFRAN ODT) 4 MG disintegrating tablet  Every 4 hours PRN     Discontinue  Reprint     01/25/20 2102           Arby Barrette, MD 01/25/20 2107

## 2020-04-30 ENCOUNTER — Ambulatory Visit: Payer: Medicare HMO | Attending: Internal Medicine

## 2020-04-30 DIAGNOSIS — Z23 Encounter for immunization: Secondary | ICD-10-CM

## 2020-04-30 NOTE — Progress Notes (Signed)
   Covid-19 Vaccination Clinic  Name:  [name]    MRN: [id number] DOB: Nov 06, 1945  04/30/2020  Ms. Coil was observed post Covid-19 immunization for 15 minutes without incident. She was provided with Vaccine Information Sheet and instruction to access the V-Safe system.   [name] was instructed to call 911 with any severe reactions post vaccine: Marland Kitchen Difficulty breathing  . Swelling of face and throat  . A fast heartbeat  . A bad rash all over body  . Dizziness and weakness

## 2020-06-25 ENCOUNTER — Encounter (HOSPITAL_BASED_OUTPATIENT_CLINIC_OR_DEPARTMENT_OTHER): Payer: Self-pay

## 2020-06-25 ENCOUNTER — Emergency Department (HOSPITAL_BASED_OUTPATIENT_CLINIC_OR_DEPARTMENT_OTHER): Payer: Medicare HMO

## 2020-06-25 ENCOUNTER — Observation Stay (HOSPITAL_COMMUNITY): Payer: Medicare HMO

## 2020-06-25 ENCOUNTER — Other Ambulatory Visit: Payer: Self-pay

## 2020-06-25 ENCOUNTER — Observation Stay (HOSPITAL_BASED_OUTPATIENT_CLINIC_OR_DEPARTMENT_OTHER)
Admission: EM | Admit: 2020-06-25 | Discharge: 2020-06-30 | Disposition: A | Discharge status: Home / Self Care | Payer: Medicare HMO | Attending: Internal Medicine | Admitting: Internal Medicine

## 2020-06-25 DIAGNOSIS — M25531 Pain in right wrist: Secondary | ICD-10-CM

## 2020-06-25 DIAGNOSIS — Z96651 Presence of right artificial knee joint: Secondary | ICD-10-CM | POA: Insufficient documentation

## 2020-06-25 DIAGNOSIS — K219 Gastro-esophageal reflux disease without esophagitis: Secondary | ICD-10-CM | POA: Diagnosis present

## 2020-06-25 DIAGNOSIS — E876 Hypokalemia: Secondary | ICD-10-CM | POA: Insufficient documentation

## 2020-06-25 DIAGNOSIS — B370 Candidal stomatitis: Secondary | ICD-10-CM | POA: Diagnosis present

## 2020-06-25 DIAGNOSIS — Z20822 Contact with and (suspected) exposure to covid-19: Secondary | ICD-10-CM | POA: Diagnosis not present

## 2020-06-25 DIAGNOSIS — M2550 Pain in unspecified joint: Secondary | ICD-10-CM | POA: Insufficient documentation

## 2020-06-25 DIAGNOSIS — Z79899 Other long term (current) drug therapy: Secondary | ICD-10-CM | POA: Diagnosis not present

## 2020-06-25 DIAGNOSIS — F17200 Nicotine dependence, unspecified, uncomplicated: Secondary | ICD-10-CM | POA: Insufficient documentation

## 2020-06-25 DIAGNOSIS — I10 Essential (primary) hypertension: Secondary | ICD-10-CM | POA: Insufficient documentation

## 2020-06-25 DIAGNOSIS — J029 Acute pharyngitis, unspecified: Secondary | ICD-10-CM | POA: Diagnosis present

## 2020-06-25 DIAGNOSIS — D696 Thrombocytopenia, unspecified: Secondary | ICD-10-CM | POA: Diagnosis not present

## 2020-06-25 DIAGNOSIS — D84821 Immunodeficiency due to drugs: Secondary | ICD-10-CM | POA: Diagnosis not present

## 2020-06-25 DIAGNOSIS — R509 Fever, unspecified: Secondary | ICD-10-CM | POA: Diagnosis not present

## 2020-06-25 DIAGNOSIS — K5090 Crohn's disease, unspecified, without complications: Secondary | ICD-10-CM | POA: Diagnosis present

## 2020-06-25 DIAGNOSIS — Z7982 Long term (current) use of aspirin: Secondary | ICD-10-CM | POA: Insufficient documentation

## 2020-06-25 DIAGNOSIS — R1310 Dysphagia, unspecified: Secondary | ICD-10-CM

## 2020-06-25 DIAGNOSIS — M11239 Other chondrocalcinosis, unspecified wrist: Secondary | ICD-10-CM | POA: Diagnosis present

## 2020-06-25 DIAGNOSIS — R531 Weakness: Secondary | ICD-10-CM

## 2020-06-25 HISTORY — DX: Crohn's disease, unspecified, without complications: K50.90

## 2020-06-25 LAB — COMPREHENSIVE METABOLIC PANEL
ALT: 32 U/L (ref 0–44)
AST: 20 U/L (ref 15–41)
Albumin: 3.5 g/dL (ref 3.5–5.0)
Alkaline Phosphatase: 47 U/L (ref 38–126)
Anion gap: 10 (ref 5–15)
BUN: 17 mg/dL (ref 8–23)
CO2: 27 mmol/L (ref 22–32)
Calcium: 9.3 mg/dL (ref 8.9–10.3)
Chloride: 103 mmol/L (ref 98–111)
Creatinine, Ser: 0.82 mg/dL (ref 0.44–1.00)
GFR, Estimated: >60 mL/min (ref >60)
Glucose, Bld: 92 mg/dL (ref 70–99)
Potassium: 3 mmol/L — ABNORMAL LOW (ref 3.5–5.1)
Sodium: 140 mmol/L (ref 135–145)
Total Bilirubin: 0.7 mg/dL (ref 0.3–1.2)
Total Protein: 5.9 g/dL — ABNORMAL LOW (ref 6.5–8.1)

## 2020-06-25 LAB — CBC WITH DIFFERENTIAL/PLATELET
Abs Immature Granulocytes: 0.08 10*3/uL — ABNORMAL HIGH (ref 0.00–0.07)
Basophils Absolute: 0 10*3/uL (ref 0.0–0.1)
Basophils Relative: 0 %
Eosinophils Absolute: 0 10*3/uL (ref 0.0–0.5)
Eosinophils Relative: 0 %
HCT: 41.9 % (ref 36.0–46.0)
Hemoglobin: 14.3 g/dL (ref 12.0–15.0)
Immature Granulocytes: 1 %
Lymphocytes Relative: 21 %
Lymphs Abs: 1.9 10*3/uL (ref 0.7–4.0)
MCH: 33 pg (ref 26.0–34.0)
MCHC: 34.1 g/dL (ref 30.0–36.0)
MCV: 96.8 fL (ref 80.0–100.0)
Monocytes Absolute: 0.5 10*3/uL (ref 0.1–1.0)
Monocytes Relative: 5 %
Neutro Abs: 6.7 10*3/uL (ref 1.7–7.7)
Neutrophils Relative %: 73 %
Platelets: 118 10*3/uL — ABNORMAL LOW (ref 150–400)
RBC: 4.33 MIL/uL (ref 3.87–5.11)
RDW: 14.3 % (ref 11.5–15.5)
WBC: 9.3 10*3/uL (ref 4.0–10.5)
nRBC: 0 % (ref 0.0–0.2)

## 2020-06-25 LAB — RESP PANEL BY RT-PCR (FLU A&B, COVID) ARPGX2
Influenza A by PCR: NEGATIVE
Influenza B by PCR: NEGATIVE
SARS Coronavirus 2 by RT PCR: NEGATIVE

## 2020-06-25 LAB — URINALYSIS, ROUTINE W REFLEX MICROSCOPIC
Bilirubin Urine: NEGATIVE
Glucose, UA: NEGATIVE mg/dL
Ketones, ur: NEGATIVE mg/dL
Nitrite: NEGATIVE
Protein, ur: NEGATIVE mg/dL
Specific Gravity, Urine: 1.01 (ref 1.005–1.030)
pH: 7.5 (ref 5.0–8.0)

## 2020-06-25 LAB — BLOOD GAS, ARTERIAL
Acid-Base Excess: 4.4 mmol/L — ABNORMAL HIGH (ref 0.0–2.0)
Bicarbonate: 28.8 mmol/L — ABNORMAL HIGH (ref 20.0–28.0)
Drawn by: 33141
O2 Saturation: 92 %
Patient temperature: 100.6
pCO2 arterial: 43.9 mmHg (ref 32.0–48.0)
pH, Arterial: 7.432 (ref 7.350–7.450)
pO2, Arterial: 64.6 mmHg — ABNORMAL LOW (ref 83.0–108.0)

## 2020-06-25 LAB — RESPIRATORY PANEL BY PCR

## 2020-06-25 LAB — URINALYSIS, MICROSCOPIC (REFLEX)

## 2020-06-25 LAB — PROCALCITONIN: Procalcitonin: <0.1 ng/mL

## 2020-06-25 LAB — GROUP A STREP BY PCR: Group A Strep by PCR: NOT DETECTED

## 2020-06-25 LAB — D-DIMER, QUANTITATIVE: D-Dimer, Quant: 0.98 ug/mL-FEU — ABNORMAL HIGH (ref 0.00–0.50)

## 2020-06-25 LAB — LACTIC ACID, PLASMA: Lactic Acid, Venous: 1.4 mmol/L (ref 0.5–1.9)

## 2020-06-25 LAB — PROTIME-INR
INR: 1 (ref 0.8–1.2)
Prothrombin Time: 12.4 seconds (ref 11.4–15.2)

## 2020-06-25 LAB — APTT: aPTT: 23 seconds — ABNORMAL LOW (ref 24–36)

## 2020-06-25 LAB — C-REACTIVE PROTEIN: CRP: 6.6 mg/dL — ABNORMAL HIGH (ref <1.0)

## 2020-06-25 LAB — MAGNESIUM: Magnesium: 2 mg/dL (ref 1.7–2.4)

## 2020-06-25 LAB — MRSA PCR SCREENING: MRSA by PCR: NEGATIVE

## 2020-06-25 LAB — SEDIMENTATION RATE: Sed Rate: 12 mm/hr (ref 0–22)

## 2020-06-25 MED ORDER — LACTATED RINGERS IV SOLN: INTRAVENOUS | Status: AC

## 2020-06-25 MED ORDER — FENTANYL CITRATE (PF) 100 MCG/2ML IJ SOLN
50.0000 ug | Freq: Once | INTRAMUSCULAR | Status: AC
  Administered 2020-06-25: 08:00:00 50 ug via INTRAVENOUS
  Filled 2020-06-25: qty 2

## 2020-06-25 MED ORDER — ONDANSETRON HCL 4 MG/2ML IJ SOLN
4.0000 mg | Freq: Once | INTRAMUSCULAR | Status: AC
  Administered 2020-06-25: 05:00:00 4 mg via INTRAVENOUS
  Filled 2020-06-25: qty 2

## 2020-06-25 MED ORDER — PHENOL 1.4 % MT LIQD
1.0000 | OROMUCOSAL | Status: DC | PRN
  Administered 2020-06-26: 09:00:00 1 via OROMUCOSAL
  Filled 2020-06-25: qty 177

## 2020-06-25 MED ORDER — PANTOPRAZOLE SODIUM 40 MG IV SOLR
40.0000 mg | Freq: Two times a day (BID) | INTRAVENOUS | Status: DC
  Administered 2020-06-25 – 2020-06-27 (×5): 40 mg via INTRAVENOUS
  Filled 2020-06-25 (×5): qty 40

## 2020-06-25 MED ORDER — SODIUM CHLORIDE 0.9 % IV SOLN
2.0000 g | Freq: Three times a day (TID) | INTRAVENOUS | Status: AC
  Administered 2020-06-25 – 2020-06-29 (×14): 2 g via INTRAVENOUS
  Filled 2020-06-25 (×15): qty 2

## 2020-06-25 MED ORDER — PANTOPRAZOLE SODIUM 40 MG IV SOLR: 40.0000 mg | INTRAVENOUS | Status: DC

## 2020-06-25 MED ORDER — POTASSIUM CHLORIDE 20 MEQ PO PACK
40.0000 meq | PACK | Freq: Two times a day (BID) | ORAL | Status: DC
  Administered 2020-06-25 – 2020-06-28 (×7): 40 meq via ORAL
  Filled 2020-06-25 (×7): qty 2

## 2020-06-25 MED ORDER — SUCRALFATE 1 GM/10ML PO SUSP
1.0000 g | Freq: Three times a day (TID) | ORAL | Status: DC
  Administered 2020-06-25 – 2020-06-30 (×19): 1 g via ORAL
  Filled 2020-06-25 (×21): qty 10

## 2020-06-25 MED ORDER — FENTANYL CITRATE (PF) 100 MCG/2ML IJ SOLN
12.5000 ug | INTRAMUSCULAR | Status: DC | PRN
  Administered 2020-06-25: 12.5 ug via INTRAVENOUS
  Filled 2020-06-25: qty 2

## 2020-06-25 MED ORDER — ONDANSETRON HCL 4 MG/2ML IJ SOLN
4.0000 mg | Freq: Four times a day (QID) | INTRAMUSCULAR | Status: DC | PRN
  Administered 2020-06-25 – 2020-06-27 (×3): 4 mg via INTRAVENOUS
  Filled 2020-06-25 (×3): qty 2

## 2020-06-25 MED ORDER — SODIUM CHLORIDE 0.9 % IV SOLN
2.0000 g | Freq: Once | INTRAVENOUS | Status: AC
  Administered 2020-06-25: 06:00:00 2 g via INTRAVENOUS
  Filled 2020-06-25: qty 2

## 2020-06-25 MED ORDER — POTASSIUM CHLORIDE CRYS ER 20 MEQ PO TBCR
40.0000 meq | EXTENDED_RELEASE_TABLET | Freq: Once | ORAL | Status: AC
  Administered 2020-06-25: 06:00:00 40 meq via ORAL
  Filled 2020-06-25: qty 2

## 2020-06-25 MED ORDER — VANCOMYCIN HCL IN DEXTROSE 1-5 GM/200ML-% IV SOLN
1000.0000 mg | Freq: Once | INTRAVENOUS | Status: AC
  Administered 2020-06-25: 07:00:00 1000 mg via INTRAVENOUS
  Filled 2020-06-25: qty 200

## 2020-06-25 MED ORDER — FENTANYL CITRATE (PF) 100 MCG/2ML IJ SOLN
50.0000 ug | Freq: Once | INTRAMUSCULAR | Status: AC
  Administered 2020-06-25: 05:00:00 50 ug via INTRAVENOUS
  Filled 2020-06-25: qty 2

## 2020-06-25 MED ORDER — ACETAMINOPHEN 325 MG PO TABS
650.0000 mg | ORAL_TABLET | Freq: Four times a day (QID) | ORAL | Status: DC | PRN
  Administered 2020-06-28 – 2020-06-30 (×4): 650 mg via ORAL
  Filled 2020-06-25 (×4): qty 2

## 2020-06-25 MED ORDER — OXYCODONE HCL 5 MG PO TABS
5.0000 mg | ORAL_TABLET | ORAL | Status: DC | PRN
  Administered 2020-06-25 – 2020-06-26 (×4): 5 mg via ORAL
  Filled 2020-06-25 (×4): qty 1

## 2020-06-25 MED ORDER — NYSTATIN 100000 UNIT/ML MT SUSP
5.0000 mL | Freq: Four times a day (QID) | OROMUCOSAL | Status: DC
  Administered 2020-06-25 – 2020-06-30 (×18): 500000 [IU] via ORAL
  Filled 2020-06-25 (×21): qty 5

## 2020-06-25 MED ORDER — ACETAMINOPHEN 650 MG RE SUPP: 650.0000 mg | Freq: Four times a day (QID) | RECTAL | Status: DC | PRN

## 2020-06-25 MED ORDER — LIDOCAINE VISCOUS HCL 2 % MT SOLN: 15.0000 mL | Freq: Once | OROMUCOSAL | Status: DC

## 2020-06-25 MED ORDER — IPRATROPIUM BROMIDE 0.02 % IN SOLN: 0.5000 mg | Freq: Four times a day (QID) | RESPIRATORY_TRACT | Status: DC | PRN

## 2020-06-25 MED ORDER — ALUM & MAG HYDROXIDE-SIMETH 200-200-20 MG/5ML PO SUSP: 30.0000 mL | Freq: Once | ORAL | Status: DC

## 2020-06-25 MED ORDER — SODIUM CHLORIDE 0.9 % IV SOLN
100.0000 mg | Freq: Two times a day (BID) | INTRAVENOUS | Status: DC
  Administered 2020-06-25 – 2020-06-28 (×7): 100 mg via INTRAVENOUS
  Filled 2020-06-25 (×8): qty 100

## 2020-06-25 MED ORDER — ONDANSETRON HCL 4 MG PO TABS: 4.0000 mg | ORAL_TABLET | Freq: Four times a day (QID) | ORAL | Status: DC | PRN

## 2020-06-25 NOTE — ED Triage Notes (Signed)
Lower back, rt shoulder and throat pain started yesterday, no known injury

## 2020-06-25 NOTE — ED Notes (Signed)
Patient's husband brought paperwork from [facility] for patient and it was left in room after ambulance left.  Faxed paperwork to [employer] at [place] at [phone]

## 2020-06-25 NOTE — ED Provider Notes (Signed)
[facility] Provider Note: Sheila Dell, MD, FACEP  CSN: [id number] MRN: [id number] ARRIVAL: 06/25/20 at 0438 ROOM: [facility]   CHIEF COMPLAINT  Sore Throat   HISTORY OF PRESENT ILLNESS  06/25/20 4:46 AM Sheila Davenport is a 74 y.o. female on prednisone and infliximab infusions for Crohn's disease.  She is here with sore throat and bilateral wrist pain that began yesterday evening about 7 PM.  The right wrist pain radiates up into her right shoulder.  She rates these pains as a 9 out of 10.  The throat pain is not worse with swallowing but the right shoulder and bilateral wrist pains are worse with movement of her joints.  She describes the pain as feeling like it is in the joints.  She was noted to have a rectal temperature of 100.6 on my evaluation and the sepsis work-up was initiated.  Her husband states she was so weak family members had to help her get up from the bed earlier this morning.  They also noted her oxygen saturation was 90% on room air at home which is low for her.  She does not feel short of breath.  She has some lower extremity edema which is chronic and she has varicose veins with recent violaceous changes in the skin of her left foot.  She also has chronic diarrhea and abdominal pain due to her Crohn's disease.  She has also been having some lower back pain which she describes as muscle spasms.  This is not as severe as the pain in her throat, wrists or shoulder.   Past Medical History:  Diagnosis Date   Crohn disease (HCC)    Diverticulosis    Dyslipidemia    GERD (gastroesophageal reflux disease)    Hypertension    Osteoporosis     Past Surgical History:  Procedure Laterality Date   HAND SURGERY Right    NASAL SINUS SURGERY     REPLACEMENT TOTAL KNEE Right     Family History  Problem Relation Age of Onset   Hypertension Other     Social History   Tobacco Use   Smoking status: Current Every Day Smoker   Smokeless tobacco: Never  Used  Substance Use Topics   Alcohol use: No   Drug use: No    Prior to Admission medications   Medication Sig Start Date End Date Taking? Authorizing Provider  amLODipine (NORVASC) 2.5 MG tablet Take 2.5 mg by mouth daily. 07/24/16   [provider]  Ascorbic Acid (VITAMIN C PO) Take 1 tablet by mouth daily.    [provider]  aspirin 81 MG tablet Take 81 mg by mouth daily.    [provider]  atenolol (TENORMIN) 50 MG tablet Take 50 mg by mouth daily.    [provider]  azaTHIOprine (IMURAN) 50 MG tablet Take by mouth.    [provider]  calcium-vitamin D (OSCAL WITH D) 500-200 MG-UNIT tablet Take 1 tablet by mouth daily.     [provider]  Cholecalciferol (VITAMIN D PO) Take 1 capsule by mouth daily.    [provider]  CRANBERRY PO Take 2 capsules by mouth 2 (two) times daily.     [provider]  dicyclomine (BENTYL) 20 MG tablet Take 20 mg by mouth 3 (three) times daily as needed for spasms.  07/27/16   [provider]  diphenoxylate-atropine (LOMOTIL) 2.5-0.025 MG tablet Take 1 tablet by mouth 3 (three) times daily as needed for diarrhea or  loose stools.  05/23/16   [provider]  ELDERBERRY PO Take 1 capsule by mouth daily.    [provider]  meclizine (ANTIVERT) 25 MG tablet Take 25 mg by mouth 3 (three) times daily as needed for dizziness.    [provider]  Multiple Vitamins-Minerals (MULTIVITAMIN WITH MINERALS) tablet Take 1 tablet by mouth daily.    [provider]  Omega-3 Fatty Acids (FISH OIL PO) Take 4 capsules by mouth daily.    [provider]  ondansetron (ZOFRAN ODT) 4 MG disintegrating tablet Take 1 tablet (4 mg total) by mouth every 4 (four) hours as needed for nausea or vomiting. 01/25/20   Arby Barrette, MD  ondansetron (ZOFRAN-ODT) 4 MG disintegrating tablet Take 2 tablets (8 mg total) by mouth every 8 (eight) hours as needed for  nausea or vomiting. 08/23/16   Osvaldo Shipper, MD  pantoprazole (PROTONIX) 40 MG tablet Take 1 tablet by mouth 2 (two) times daily. 10/28/19   [provider]  Probiotic Product (PROBIOTIC PO) Take 1 capsule by mouth daily.    [provider]  ranitidine (ZANTAC) 150 MG tablet Take 150 mg by mouth 3 (three) times daily as needed for heartburn.     [provider]  rosuvastatin (CRESTOR) 10 MG tablet TAKE 1 TABLET(10 MG) BY MOUTH DAILY 11/18/19   [provider]  sertraline (ZOLOFT) 100 MG tablet Take 100 mg by mouth daily.     [provider]  solifenacin (VESICARE) 10 MG tablet Take by mouth. 09/03/19   [provider]  telmisartan (MICARDIS) 40 MG tablet Take by mouth. 10/19/19   [provider]  valsartan (DIOVAN) 160 MG tablet Take 1 tablet (160 mg total) by mouth daily. RESUME AFTER 3 DAYS 08/23/16   Osvaldo Shipper, MD    Allergies Levofloxacin, Nitrofuran derivatives, Phenazopyridine, Decongest-aid [pseudoephedrine], and Other   REVIEW OF SYSTEMS  Negative except as noted here or in the History of Present Illness.   PHYSICAL EXAMINATION  Initial Vital Signs Blood pressure (!) 148/64, pulse 67, temperature 98.8 F (37.1 C), temperature source Oral, resp. rate 14, height 5\' 3"  (1.6 m), weight 77.1 kg, SpO2 93 %.  Examination General: Well-developed, well-nourished female in no acute distress; appearance consistent with age of record HENT: normocephalic; atraumatic Eyes: pupils equal, round and reactive to light; extraocular muscles intact; bilateral pseudophakia Neck: supple; no pain on movement of neck Heart: regular rate and rhythm Lungs: clear to auscultation bilaterally Abdomen: soft; obese; mild diffuse tenderness; bowel sounds present Extremities: No deformity; full range of motion; pulses normal; trace edema of lower legs; multiple varicosities with violaceous discoloration of left foot Neurologic: Awake, alert and  oriented; motor function intact in all extremities and symmetric; no facial droop Skin: Warm and dry; palmar erythema bilaterally Psychiatric: Flat affect   RESULTS  Summary of this visit's results, reviewed and interpreted by myself:   EKG Interpretation  Date/Time:  Saturday June 25 2020 05:07:26 EST Ventricular Rate:  67 PR Interval:    QRS Duration: 96 QT Interval:  388 QTC Calculation: 410 R Axis:   -4 Text Interpretation: Sinus rhythm Low voltage, precordial leads Borderline repolarization abnormality No significant change was found Confirmed by Sybol Morre, 08[birth year]1975 (Jonny Ruiz) on 06/25/2020 5:12:07 AM      Laboratory Studies: Results for orders placed or performed during the hospital encounter of 06/25/20 (from the past 24 hour(s))  Urinalysis, Routine w reflex microscopic Urine, Clean Catch     Status: Abnormal   Collection  Time: 06/25/20  5:00 AM  Result Value Ref Range   Color, Urine YELLOW YELLOW   APPearance HAZY (A) CLEAR   Specific Gravity, Urine 1.010 1.005 - 1.030   pH 7.5 5.0 - 8.0   Glucose, UA NEGATIVE NEGATIVE mg/dL   Hgb urine dipstick SMALL (A) NEGATIVE   Bilirubin Urine NEGATIVE NEGATIVE   Ketones, ur NEGATIVE NEGATIVE mg/dL   Protein, ur NEGATIVE NEGATIVE mg/dL   Nitrite NEGATIVE NEGATIVE   Leukocytes,Ua TRACE (A) NEGATIVE  Urinalysis, Microscopic (reflex)     Status: Abnormal   Collection Time: 06/25/20  5:00 AM  Result Value Ref Range   RBC / HPF 6-10 0 - 5 RBC/hpf   WBC, UA 0-5 0 - 5 WBC/hpf   Bacteria, UA FEW (A) NONE SEEN   Squamous Epithelial / LPF 11-20 0 - 5  Group A Strep by PCR     Status: None   Collection Time: 06/25/20  5:06 AM   Specimen: Throat; Sterile Swab  Result Value Ref Range   Group A Strep by PCR NOT DETECTED NOT DETECTED  Lactic acid, plasma     Status: None   Collection Time: 06/25/20  5:10 AM  Result Value Ref Range   Lactic Acid, Venous 1.4 0.5 - 1.9 mmol/L  Comprehensive metabolic panel     Status: Abnormal   Collection  Time: 06/25/20  5:10 AM  Result Value Ref Range   Sodium 140 135 - 145 mmol/L   Potassium 3.0 (L) 3.5 - 5.1 mmol/L   Chloride 103 98 - 111 mmol/L   CO2 27 22 - 32 mmol/L   Glucose, Bld 92 70 - 99 mg/dL   BUN 17 8 - 23 mg/dL   Creatinine, Ser 1.61 0.44 - 1.00 mg/dL   Calcium 9.3 8.9 - 09.6 mg/dL   Total Protein 5.9 (L) 6.5 - 8.1 g/dL   Albumin 3.5 3.5 - 5.0 g/dL   AST 20 15 - 41 U/L   ALT 32 0 - 44 U/L   Alkaline Phosphatase 47 38 - 126 U/L   Total Bilirubin 0.7 0.3 - 1.2 mg/dL   GFR, Estimated >04 >54 mL/min   Anion gap 10 5 - 15  CBC WITH DIFFERENTIAL     Status: Abnormal   Collection Time: 06/25/20  5:10 AM  Result Value Ref Range   WBC 9.3 4.0 - 10.5 K/uL   RBC 4.33 3.87 - 5.11 MIL/uL   Hemoglobin 14.3 12.0 - 15.0 g/dL   HCT 09.8 11.9 - 14.7 %   MCV 96.8 80.0 - 100.0 fL   MCH 33.0 26.0 - 34.0 pg   MCHC 34.1 30.0 - 36.0 g/dL   RDW 82.9 56.2 - 13.0 %   Platelets 118 (L) 150 - 400 K/uL   nRBC 0.0 0.0 - 0.2 %   Neutrophils Relative % 73 %   Neutro Abs 6.7 1.7 - 7.7 K/uL   Lymphocytes Relative 21 %   Lymphs Abs 1.9 0.7 - 4.0 K/uL   Monocytes Relative 5 %   Monocytes Absolute 0.5 0.1 - 1.0 K/uL   Eosinophils Relative 0 %   Eosinophils Absolute 0.0 0.0 - 0.5 K/uL   Basophils Relative 0 %   Basophils Absolute 0.0 0.0 - 0.1 K/uL   WBC Morphology MORPHOLOGY UNREMARKABLE    Smear Review PLATELET COUNT CONFIRMED BY SMEAR    Immature Granulocytes 1 %   Abs Immature Granulocytes 0.08 (H) 0.00 - 0.07 K/uL   Stomatocytes PRESENT    Giant PLTs  PRESENT   Protime-INR     Status: None   Collection Time: 06/25/20  5:10 AM  Result Value Ref Range   Prothrombin Time 12.4 11.4 - 15.2 seconds   INR 1.0 0.8 - 1.2  APTT     Status: Abnormal   Collection Time: 06/25/20  5:10 AM  Result Value Ref Range   aPTT 23 (L) 24 - 36 seconds  Resp Panel by RT-PCR (Flu A&B, Covid) Nasopharyngeal Swab     Status: None   Collection Time: 06/25/20  5:12 AM   Specimen: Nasopharyngeal Swab;  Nasopharyngeal(NP) swabs in vial transport medium  Result Value Ref Range   SARS Coronavirus 2 by RT PCR NEGATIVE NEGATIVE   Influenza A by PCR NEGATIVE NEGATIVE   Influenza B by PCR NEGATIVE NEGATIVE   Imaging Studies: DG Chest Port 1 View  Result Date: 06/25/2020 CLINICAL DATA:  Sepsis. EXAM: PORTABLE CHEST 1 VIEW COMPARISON:  04/28/2020 FINDINGS: 0512 hours. Cardiopericardial silhouette is at upper limits of normal for size. The lungs are clear without focal pneumonia, edema, pneumothorax or pleural effusion. Interstitial markings are diffusely coarsened with chronic features. Nonacute rib fractures noted bilaterally. Telemetry leads overlie the chest. IMPRESSION: Chronic interstitial coarsening without acute cardiopulmonary findings. Electronically Signed   By: Kennith Center M.D.   On: 06/25/2020 06:02    ED COURSE and MDM  Nursing notes, initial and subsequent vitals signs, including pulse oximetry, reviewed and interpreted by myself.  Vitals:   06/25/20 0519 06/25/20 0531 06/25/20 0600 06/25/20 0630  BP:  137/69 (!) 157/79 (!) 147/68  Pulse:  65 68 70  Resp:  17 20 20   Temp: (!) 100.6 F (38.1 C)     TempSrc: Rectal     SpO2: 94% 93% 94% 96%  Weight:      Height:       Medications  lactated ringers infusion ( Intravenous New Bag/Given 06/25/20 0515)  ceFEPIme (MAXIPIME) 2 g in sodium chloride 0.9 % 100 mL IVPB (2 g Intravenous New Bag/Given 06/25/20 0627)  vancomycin (VANCOCIN) IVPB 1000 mg/200 mL premix (has no administration in time range)  ondansetron (ZOFRAN) injection 4 mg (4 mg Intravenous Given 06/25/20 0516)  fentaNYL (SUBLIMAZE) injection 50 mcg (50 mcg Intravenous Given 06/25/20 0516)  potassium chloride SA (KLOR-CON) CR tablet 40 mEq (40 mEq Oral Given 06/25/20 0627)   6:14 AM Patient has a febrile illness and atypical symptoms without clear source of infection.  She is negative for COVID-19 and influenza.  Her urinalysis does not show urinary tract infection.   Her chest x-ray does not show pneumonia.  She is immunocompromised being on prednisone and infliximab.  We will go ahead and start antibiotics (Maxipime and vancomycin) pending cultures and other studies.  6:32 AM Patient receives her [facility] through [facility] but that hospital is currently at capacity.  We will have her admitted to the [facility] hospitalist service.   PROCEDURES  Procedures CRITICAL CARE Performed by: [facility] Erma Joubert Total critical care time: 30 minutes Critical care time was exclusive of separately billable procedures and treating other patients. Critical care was necessary to treat or prevent imminent or life-threatening deterioration. Critical care was time spent personally by me on the following activities: development of treatment plan with patient and/or surrogate as well as nursing, discussions with consultants, evaluation of patient's response to treatment, examination of patient, obtaining history from patient or surrogate, ordering and performing treatments and interventions, ordering and review of laboratory studies, ordering and  review of radiographic studies, pulse oximetry and re-evaluation of patient's condition.   ED DIAGNOSES     ICD-10-CM   1. Febrile illness  R50.9   2. Polyarthralgia  M25.50   3. Sore throat  J02.9   4. Hypokalemia  E87.6   5. Thrombocytopenia (HCC)  D69.6   6. 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

## 2020-06-25 NOTE — H&P (Signed)
History and Physical    Eevee Borbon PPJ:[id number] DOB: 02/19/1946 DOA: 06/25/2020  PCP: Patient, No Pcp Per  Patient coming from: [facility]  Chief Complaint: Sore throat  HPI: Blinda Turek is a 74 y.o. female with medical history significant of Chron's, GERD, HLD, HTN. Presenting with bilateral wrist pain, right shoulder pain and sore throat. She reports that all her symptoms started at 7:30 last night. The pain in the wrists/shoulder are achy and constant. Her sore throat is actually described as throat make that makes it difficult to swallow pills, solids. She didn't try any medicines for her condition at home. She decided to go to bed. She woke up in the night with pain and feeling generally weak. Her family had to help her up from the bathroom to get her back to bed. They decided that they needed to come to the ED.    ED Course: Fever of 100.6 was recorded. UA was unremarkable. CXR was unremarkable -- although she had a new O2 requirement. She was COVID negative. She was started on vanc and cefepime. TRH was called for admission.   Review of Systems:  Denies CP, palpitations, N, V, D. Review of systems is otherwise negative for all not mentioned in HPI.   PMHx Past Medical History:  Diagnosis Date  . Crohn disease (HCC)   . Diverticulosis   . Dyslipidemia   . GERD (gastroesophageal reflux disease)   . Hypertension   . Osteoporosis     PSHx Past Surgical History:  Procedure Laterality Date  . HAND SURGERY Right   . NASAL SINUS SURGERY    . REPLACEMENT TOTAL KNEE Right     SocHx  reports that she has been smoking. She has never used smokeless tobacco. She reports that she does not drink alcohol and does not use drugs.  Allergies  Allergen Reactions  . Levofloxacin Other (See Comments)    Unknown  . Nitrofuran Derivatives Other (See Comments)    Unknown  . Phenazopyridine Other (See Comments)    Unknown  . Decongest-Aid [Pseudoephedrine] Palpitations  . Other Palpitations  and Other (See Comments)    Antihistamine; Reactions: Tachycardia Decongestant; Reactions: Tachycardia    FamHx Family History  Problem Relation Age of Onset  . Hypertension Other     Prior to Admission medications   Medication Sig Start Date End Date Taking? Authorizing Provider  amLODipine (NORVASC) 2.5 MG tablet Take 2.5 mg by mouth daily. 07/24/16   [provider]  Ascorbic Acid (VITAMIN C PO) Take 1 tablet by mouth daily.    [provider]  aspirin 81 MG tablet Take 81 mg by mouth daily.    [provider]  atenolol (TENORMIN) 50 MG tablet Take 50 mg by mouth daily.    [provider]  azaTHIOprine (IMURAN) 50 MG tablet Take by mouth.    [provider]  calcium-vitamin D (OSCAL WITH D) 500-200 MG-UNIT tablet Take 1 tablet by mouth daily.     [provider]  Cholecalciferol (VITAMIN D PO) Take 1 capsule by mouth daily.    [provider]  CRANBERRY PO Take 2 capsules by mouth 2 (two) times daily.     [provider]  dicyclomine (BENTYL) 20 MG tablet Take 20 mg by mouth 3 (three) times daily as needed for spasms.  07/27/16   [provider]  diphenoxylate-atropine (LOMOTIL) 2.5-0.025 MG tablet Take 1 tablet by mouth 3 (three) times daily as needed for diarrhea or loose stools.  05/23/16  [provider]  ELDERBERRY PO Take 1 capsule by mouth daily.    [provider]  meclizine (ANTIVERT) 25 MG tablet Take 25 mg by mouth 3 (three) times daily as needed for dizziness.    [provider]  Multiple Vitamins-Minerals (MULTIVITAMIN WITH MINERALS) tablet Take 1 tablet by mouth daily.    [provider]  Omega-3 Fatty Acids (FISH OIL PO) Take 4 capsules by mouth daily.    [provider]  ondansetron (ZOFRAN ODT) 4 MG disintegrating tablet Take 1 tablet (4 mg total) by mouth every 4 (four) hours as needed for nausea or vomiting. 01/25/20   Arby Barrette, MD   ondansetron (ZOFRAN-ODT) 4 MG disintegrating tablet Take 2 tablets (8 mg total) by mouth every 8 (eight) hours as needed for nausea or vomiting. 08/23/16   Osvaldo Shipper, MD  pantoprazole (PROTONIX) 40 MG tablet Take 1 tablet by mouth 2 (two) times daily. 10/28/19   [provider]  Probiotic Product (PROBIOTIC PO) Take 1 capsule by mouth daily.    [provider]  ranitidine (ZANTAC) 150 MG tablet Take 150 mg by mouth 3 (three) times daily as needed for heartburn.     [provider]  rosuvastatin (CRESTOR) 10 MG tablet TAKE 1 TABLET(10 MG) BY MOUTH DAILY 11/18/19   [provider]  sertraline (ZOLOFT) 100 MG tablet Take 100 mg by mouth daily.     [provider]  solifenacin (VESICARE) 10 MG tablet Take by mouth. 09/03/19   [provider]  telmisartan (MICARDIS) 40 MG tablet Take by mouth. 10/19/19   [provider]  valsartan (DIOVAN) 160 MG tablet Take 1 tablet (160 mg total) by mouth daily. RESUME AFTER 3 DAYS 08/23/16   Osvaldo Shipper, MD    Physical Exam: Vitals:   06/25/20 0730 06/25/20 0812 06/25/20 0830 06/25/20 0927  BP: 137/66  131/63 (!) 138/59  Pulse: 72  72 76  Resp: (!) 24  16 20   Temp:  100.3 F (37.9 C)  (!) 100.6 F (38.1 C)  TempSrc:  Rectal  Rectal  SpO2: 94%  92% 93%  Weight:      Height:        General: 74 y.o. female resting in bed in NAD Eyes: PERRL, normal sclera ENMT: Nares patent w/o discharge, orophaynx clear, dentition normal, ears w/o discharge/lesions/ulcers Neck: Supple, trachea midline Cardiovascular: RRR, +S1, S2, no m/g/r, equal pulses throughout Respiratory: decreased at bases, no w/r/r, normal WOB on 2L GI: BS+, NDNT, no masses noted, no organomegaly noted MSK: No c/c; b/l wrist swelling w/ limited ROM of both wrists. Limited ROM of R shoulder Skin: No bruises, ulcerations noted; maculopapular rash on bilat palms Neuro: A&O x 3, no focal deficits Psyc: Appropriate interaction and  affect, calm/cooperative  Labs on Admission: I have personally reviewed following labs and imaging studies  CBC: Recent Labs  Lab 06/25/20 0510  WBC 9.3  NEUTROABS 6.7  HGB 14.3  HCT 41.9  MCV 96.8  PLT 118*   Basic Metabolic Panel: Recent Labs  Lab 06/25/20 0510  NA 140  K 3.0*  CL 103  CO2 27  GLUCOSE 92  BUN 17  CREATININE 0.82  CALCIUM 9.3   GFR: Estimated Creatinine Clearance: 58.3 mL/min (by C-G formula based on SCr of 0.82 mg/dL). Liver Function Tests: Recent Labs  Lab 06/25/20 0510  AST 20  ALT 32  ALKPHOS 47  BILITOT 0.7  PROT 5.9*  ALBUMIN 3.5   No results for input(s):  LIPASE, AMYLASE in the last 168 hours. No results for input(s): AMMONIA in the last 168 hours. Coagulation Profile: Recent Labs  Lab 06/25/20 0510  INR 1.0   Cardiac Enzymes: No results for input(s): CKTOTAL, CKMB, CKMBINDEX, TROPONINI in the last 168 hours. BNP (last 3 results) No results for input(s): PROBNP in the last 8760 hours. HbA1C: No results for input(s): HGBA1C in the last 72 hours. CBG: No results for input(s): GLUCAP in the last 168 hours. Lipid Profile: No results for input(s): CHOL, HDL, LDLCALC, TRIG, CHOLHDL, LDLDIRECT in the last 72 hours. Thyroid Function Tests: No results for input(s): TSH, T4TOTAL, FREET4, T3FREE, THYROIDAB in the last 72 hours. Anemia Panel: No results for input(s): VITAMINB12, FOLATE, FERRITIN, TIBC, IRON, RETICCTPCT in the last 72 hours. Urine analysis:    Component Value Date/Time   COLORURINE YELLOW 06/25/2020 0500   APPEARANCEUR HAZY (A) 06/25/2020 0500   LABSPEC 1.010 06/25/2020 0500   PHURINE 7.5 06/25/2020 0500   GLUCOSEU NEGATIVE 06/25/2020 0500   HGBUR SMALL (A) 06/25/2020 0500   BILIRUBINUR NEGATIVE 06/25/2020 0500   KETONESUR NEGATIVE 06/25/2020 0500   PROTEINUR NEGATIVE 06/25/2020 0500   NITRITE NEGATIVE 06/25/2020 0500   LEUKOCYTESUR TRACE (A) 06/25/2020 0500    Radiological Exams on Admission: DG Chest Port 1  View  Result Date: 06/25/2020 CLINICAL DATA:  Sepsis. EXAM: PORTABLE CHEST 1 VIEW COMPARISON:  04/28/2020 FINDINGS: 0512 hours. Cardiopericardial silhouette is at upper limits of normal for size. The lungs are clear without focal pneumonia, edema, pneumothorax or pleural effusion. Interstitial markings are diffusely coarsened with chronic features. Nonacute rib fractures noted bilaterally. Telemetry leads overlie the chest. IMPRESSION: Chronic interstitial coarsening without acute cardiopulmonary findings. Electronically Signed   By: Kennith Center M.D.   On: 06/25/2020 06:02    EKG: Independently reviewed. Sinus, no st elevations  Assessment/Plan FUO     - UA unremarkable, CXR unremarkable, COVID+     - temp 100.6     - she has a rash on the palm of her hands, joint pain, and thrombocytopenia --> things that could indicate RMSF; but she says she has no exposures; sending RMSF labs and adding doxy to her regimen     - could also be an arthritis flare, she is already on steroids at baseline, will add solumedrol 40mg  daily     - was started on vanc/cefepime in ED, procal is 0.10. Continue this regimen for 24 hours. If rpt procal in AM is negative, would stop. Check MRSA swab     - new O2 requirement; known smoker with history of COPD not on inhalers; check RVP and repeat CXR in AM  Odynophagia Hx of Crohn's     - sore throat, painful swallowing started last night abruptly     - spoke with [facility] ([place]), rec'd nystatin S&S, carafate, protonix; follow up with primary GI (seen in [facility])     - she is on humera and prednisone for Crohn's  Hypoxia Hx of COPD Tobacco abuse     - counseled against further tobacco use     - check ABG     - nebs, IS, wean O2 as able  HTN     - resume home meds  HLD     - resume home meds   DVT prophylaxis: SCDs  Code Status: FULL  Family Communication: With husband at bedside.  Consults called: [facility]   Status is:  Observation  The patient remains OBS appropriate and will d/c  before 2 midnights.  Dispo: The patient is from: Home              Anticipated d/c is to: Home              Anticipated d/c date is: 1 day              Patient currently is not medically stable to d/c.   Teddy Spike DO [facility]  If 7PM-7AM, please contact night-coverage [email or web address]  06/25/2020, 10:33 AM

## 2020-06-26 DIAGNOSIS — R531 Weakness: Secondary | ICD-10-CM | POA: Diagnosis not present

## 2020-06-26 DIAGNOSIS — M2550 Pain in unspecified joint: Secondary | ICD-10-CM | POA: Diagnosis present

## 2020-06-26 DIAGNOSIS — D84821 Immunodeficiency due to drugs: Secondary | ICD-10-CM | POA: Diagnosis not present

## 2020-06-26 DIAGNOSIS — R509 Fever, unspecified: Secondary | ICD-10-CM | POA: Diagnosis not present

## 2020-06-26 DIAGNOSIS — E876 Hypokalemia: Secondary | ICD-10-CM | POA: Diagnosis not present

## 2020-06-26 LAB — URINE CULTURE

## 2020-06-26 LAB — CBC
HCT: 42.3 % (ref 36.0–46.0)
Hemoglobin: 13.9 g/dL (ref 12.0–15.0)
MCH: 32.7 pg (ref 26.0–34.0)
MCHC: 32.9 g/dL (ref 30.0–36.0)
MCV: 99.5 fL (ref 80.0–100.0)
Platelets: UNDETERMINED 10*3/uL (ref 150–400)
RBC: 4.25 MIL/uL (ref 3.87–5.11)
RDW: 14.5 % (ref 11.5–15.5)
WBC: 7.1 10*3/uL (ref 4.0–10.5)
nRBC: 0 % (ref 0.0–0.2)

## 2020-06-26 LAB — COMPREHENSIVE METABOLIC PANEL
ALT: 28 U/L (ref 0–44)
AST: 20 U/L (ref 15–41)
Albumin: 3.1 g/dL — ABNORMAL LOW (ref 3.5–5.0)
Alkaline Phosphatase: 44 U/L (ref 38–126)
Anion gap: 11 (ref 5–15)
BUN: 10 mg/dL (ref 8–23)
CO2: 25 mmol/L (ref 22–32)
Calcium: 8.5 mg/dL — ABNORMAL LOW (ref 8.9–10.3)
Chloride: 102 mmol/L (ref 98–111)
Creatinine, Ser: 0.73 mg/dL (ref 0.44–1.00)
GFR, Estimated: >60 mL/min (ref >60)
Glucose, Bld: 108 mg/dL — ABNORMAL HIGH (ref 70–99)
Potassium: 3 mmol/L — ABNORMAL LOW (ref 3.5–5.1)
Sodium: 138 mmol/L (ref 135–145)
Total Bilirubin: 1.6 mg/dL — ABNORMAL HIGH (ref 0.3–1.2)
Total Protein: 5.7 g/dL — ABNORMAL LOW (ref 6.5–8.1)

## 2020-06-26 LAB — MAGNESIUM: Magnesium: 1.7 mg/dL (ref 1.7–2.4)

## 2020-06-26 LAB — CK: Total CK: 39 U/L (ref 38–234)

## 2020-06-26 MED ORDER — FLUCONAZOLE 100 MG PO TABS
100.0000 mg | ORAL_TABLET | Freq: Every day | ORAL | Status: DC
  Administered 2020-06-26 – 2020-06-29 (×4): 100 mg via ORAL
  Filled 2020-06-26 (×4): qty 1

## 2020-06-26 MED ORDER — COLCHICINE 0.6 MG PO TABS
0.6000 mg | ORAL_TABLET | Freq: Two times a day (BID) | ORAL | Status: DC
  Administered 2020-06-26 – 2020-06-30 (×9): 0.6 mg via ORAL
  Filled 2020-06-26 (×9): qty 1

## 2020-06-26 MED ORDER — ORAL CARE MOUTH RINSE
15.0000 mL | Freq: Two times a day (BID) | OROMUCOSAL | Status: DC
  Administered 2020-06-26 – 2020-06-30 (×6): 15 mL via OROMUCOSAL

## 2020-06-26 MED ORDER — SERTRALINE HCL 100 MG PO TABS
100.0000 mg | ORAL_TABLET | Freq: Every day | ORAL | Status: DC
  Administered 2020-06-26 – 2020-06-30 (×5): 100 mg via ORAL
  Filled 2020-06-26 (×5): qty 1

## 2020-06-26 NOTE — Progress Notes (Addendum)
PROGRESS NOTE    Sheila Davenport  IDP:[id number] DOB: 1945-11-03 DOA: 06/25/2020 PCP: Patient, No Pcp Per    Chief Complaint  Patient presents with  . Sore Throat    Brief Narrative:   Sheila Davenport is a 74 y.o. female with medical history significant of Crohn's disease on prednisone and infliximab, on chronic immunosuppression,  GERD, HLD, HTN   Presents with bilateral wrist pain, bilateral shoulder pain and sore throat, dysphagia, generalized weakness, nausea, fever of 100.6. Blood cultures drawn and pending. Respiratory panel is negative. CXR, UA is negative for infection.   Assessment & Plan:   Active Problems:   Fever in adult  Polymyalgia Polyarthralgia:  - ESR wnl, CRP is elevated.  ? Prednisone induces polymyositis. Get CK levels.  Pain control.  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 Started the patient on colchicine. Pain control.  PT evaluation/ OT evaluation ordered.    Right wrist swelling:  ? Septic arthritis secondary to immunocompromised state.  Orthopedics will be consulted.    Fever of unclear etiology: Work up so far is negative. Currently on broad spectrum IV antibiotics.  Procalcitonin is negative, WBC count within normal limits.  Possibly DC IV cefepime if cultures are negative.  Rash on the palms, polyarthralgia, fever:  [facility] labs sent on admission.  She was started Doxycycline.    Dysphagia,  White coating of the tongue, painful oral cavity,  ? Oral candidiasis from chronic immunosuppresion Able to tolerate liquid diet.  SLP eval ordered.  Advance diet as tolerated.  Nystatin, and oral diflucan ordered, along with sucralfate     GERD;  On PPI.    Hypokalemia:  Replaced.    Hypertension:  Well controlled BP parameters. .    Crohn's disease Patient reports she is just coming off prednisone after a flareup.  Patient currently on IV steroids.  Infliximab on hold for  now.    DVT prophylaxis: Lovenox.  Code Status: (Full code.  Family Communication: none at bedside.  Disposition:   Status is: Observation  The patient will require care spanning > 2 midnights and should be moved to inpatient because: Ongoing diagnostic testing needed not appropriate for outpatient work up and IV treatments appropriate due to intensity of illness or inability to take PO  Dispo: The patient is from: Home              Anticipated d/c is to: pending.               Anticipated d/c date is: 2 days              Patient currently is not medically stable to d/c.       Consultants:   None.  Procedures:None.  Antimicrobials:  Antibiotics Given (last 72 hours)    Date/Time Action Medication Dose Rate   06/25/20 0627 New Bag/Given   ceFEPIme (MAXIPIME) 2 g in sodium chloride 0.9 % 100 mL IVPB 2 g 200 mL/hr   06/25/20 0658 New Bag/Given   vancomycin (VANCOCIN) IVPB 1000 mg/200 mL premix 1,000 mg 200 mL/hr   06/25/20 1233 New Bag/Given   doxycycline (VIBRAMYCIN) 100 mg in sodium chloride 0.9 % 250 mL IVPB 100 mg 125 mL/hr   06/25/20 1516 New Bag/Given   ceFEPIme (MAXIPIME) 2 g in sodium chloride 0.9 % 100 mL IVPB 2 g 200 mL/hr   06/25/20 2224 New Bag/Given   ceFEPIme (MAXIPIME) 2 g in sodium chloride 0.9 % 100 mL IVPB  2 g 200 mL/hr   06/25/20 2319 New Bag/Given  [no additional iv access]   doxycycline (VIBRAMYCIN) 100 mg in sodium chloride 0.9 % 250 mL IVPB 100 mg 125 mL/hr   06/26/20 0506 New Bag/Given   ceFEPIme (MAXIPIME) 2 g in sodium chloride 0.9 % 100 mL IVPB 2 g 200 mL/hr   06/26/20 7672 New Bag/Given   doxycycline (VIBRAMYCIN) 100 mg in sodium chloride 0.9 % 250 mL IVPB 100 mg 125 mL/hr       Subjective: Reports generalized weakness.  No chest pain or sob.  Hurts everywhere.   Objective: Vitals:   06/25/20 0927 06/25/20 1401 06/25/20 1735 06/26/20 0536  BP: (!) 138/59 124/88 130/66 (!) 162/82  Pulse: 76 87 82 82  Resp: 20 (!) $Remo'24 20 20  'leSDO$ Temp:  (!) 100.6 F (38.1 C) 100 F (37.8 C) 99 F (37.2 C) 99 F (37.2 C)  TempSrc: Rectal Oral  Oral  SpO2: 93% 93% 94% 97%  Weight:      Height:        Intake/Output Summary (Last 24 hours) at 06/26/2020 0945 Last data filed at 06/26/2020 0500 Gross per 24 hour  Intake 2329.42 ml  Output 1700 ml  Net 629.42 ml   Filed Weights   06/25/20 0441  Weight: 77.1 kg    Examination:  General exam: Appears calm and comfortable  Respiratory system: Clear to auscultation. Respiratory effort normal. Cardiovascular system: S1 & S2 heard, RRR. No pedal edema. Gastrointestinal system: Abdomen is nondistended, soft and nontender.Normal bowel sounds heard. Central nervous system: Alert and oriented. Generalized weakness.  Extremities: painful ROM of all the extremities and joints. Skin: erythematous rash on the foot,  Psychiatry:. Mood & affect appropriate.     Data Reviewed: I have personally reviewed following labs and imaging studies  CBC: Recent Labs  Lab 06/25/20 0510 06/26/20 0516  WBC 9.3 7.1  NEUTROABS 6.7  --   HGB 14.3 13.9  HCT 41.9 42.3  MCV 96.8 99.5  PLT 118* PLATELET CLUMPS NOTED ON SMEAR, UNABLE TO ESTIMATE    Basic Metabolic Panel: Recent Labs  Lab 06/25/20 0510 06/25/20 1208 06/26/20 0516  NA 140  --  138  K 3.0*  --  3.0*  CL 103  --  102  CO2 27  --  25  GLUCOSE 92  --  108*  BUN 17  --  10  CREATININE 0.82  --  0.73  CALCIUM 9.3  --  8.5*  MG  --  2.0  --     GFR: Estimated Creatinine Clearance: 59.8 mL/min (by C-G formula based on SCr of 0.73 mg/dL).  Liver Function Tests: Recent Labs  Lab 06/25/20 0510 06/26/20 0516  AST 20 20  ALT 32 28  ALKPHOS 47 44  BILITOT 0.7 1.6*  PROT 5.9* 5.7*  ALBUMIN 3.5 3.1*    CBG: No results for input(s): GLUCAP in the last 168 hours.   Recent Results (from the past 240 hour(s))  Urine culture     Status: Abnormal   Collection Time: 06/25/20  5:00 AM   Specimen: In/Out Cath Urine  Result Value  Ref Range Status   Specimen Description   Final    IN/OUT CATH URINE Performed at [facility], [address] [place], [place] [address]    Special Requests   Final    NONE Performed at [facility], [address] [place], [place] [address]    Culture MULTIPLE SPECIES  PRESENT, SUGGEST RECOLLECTION (A)  Final   Report Status 06/26/2020 FINAL  Final  Group A Strep by PCR     Status: None   Collection Time: 06/25/20  5:06 AM   Specimen: Throat; Sterile Swab  Result Value Ref Range Status   Group A Strep by PCR NOT DETECTED NOT DETECTED Final    Comment: Performed at [facility], [address] [place], [place] [address]  Blood Culture (routine x 2)     Status: None (Preliminary result)   Collection Time: 06/25/20  5:10 AM   Specimen: BLOOD LEFT WRIST  Result Value Ref Range Status   Specimen Description   Final    BLOOD LEFT WRIST Performed at [facility], [address] [place], [place] [address]    Special Requests   Final    BOTTLES DRAWN AEROBIC AND ANAEROBIC Blood Culture adequate volume Performed at [facility], [address] [place], [place] [address]    Culture   Final    NO GROWTH < 12 HOURS Performed at [facility], [place] [address] [place],  [address]    Report Status PENDING  Incomplete  Resp Panel by RT-PCR (Flu A&B, Covid) Nasopharyngeal Swab     Status: None   Collection Time: 06/25/20  5:12 AM   Specimen: Nasopharyngeal Swab; Nasopharyngeal(NP) swabs in vial transport medium  Result Value Ref Range Status   SARS Coronavirus 2 by RT PCR NEGATIVE NEGATIVE Final    Comment: (NOTE) SARS-CoV-2 target nucleic acids are NOT DETECTED.  The SARS-CoV-2 RNA is generally detectable in upper respiratory specimens during the acute phase of infection. The lowest concentration of SARS-CoV-2 viral copies this assay can detect is 138 copies/mL. A negative result does not preclude  SARS-Cov-2 infection and should not be used as the sole basis for treatment or other patient management decisions. A negative result may occur with  improper specimen collection/handling, submission of specimen other than nasopharyngeal swab, presence of viral mutation(s) within the areas targeted by this assay, and inadequate number of viral copies(<138 copies/mL). A negative result must be combined with clinical observations, patient history, and epidemiological information. The expected result is Negative.  Fact Sheet for Patients:  EntrepreneurPulse.com.au  Fact Sheet for Healthcare Providers:  IncredibleEmployment.be  This test is no t yet approved or cleared by the [place] FDA and  has been authorized for detection and/or diagnosis of SARS-CoV-2 by FDA under an Emergency Use Authorization (EUA). This EUA will remain  in effect (meaning this test can be used) for the duration of the COVID-19 declaration under Section 564(b)(1) of the Act, 21 U.S.C.section 360bbb-3(b)(1), unless the authorization is terminated  or revoked sooner.       Influenza A by PCR NEGATIVE NEGATIVE Final   Influenza B by PCR NEGATIVE NEGATIVE Final    Comment: (NOTE) The Xpert Xpress SARS-CoV-2/FLU/RSV plus assay is intended as an aid in the diagnosis of influenza from Nasopharyngeal swab specimens and should not be used as a sole basis for treatment. Nasal washings and aspirates are unacceptable for Xpert Xpress SARS-CoV-2/FLU/RSV testing.  Fact Sheet for Patients: EntrepreneurPulse.com.au  Fact Sheet for Healthcare Providers: IncredibleEmployment.be  This test is not yet approved or cleared by the [place] FDA and has been authorized for detection and/or diagnosis of SARS-CoV-2 by FDA under an Emergency Use Authorization (EUA). This EUA will remain in effect (meaning this test can be used) for the duration of  the  COVID-19 declaration under Section 564(b)(1) of the Act, 21 U.S.C. section 360bbb-3(b)(1), unless the authorization is terminated or revoked.  Performed at [facility], [address] [place], [place] [address]   Blood Culture (routine x 2)     Status: None (Preliminary result)   Collection Time: 06/25/20  5:29 AM   Specimen: BLOOD RIGHT FOREARM  Result Value Ref Range Status   Specimen Description   Final    BLOOD RIGHT FOREARM Performed at [facility], [address] [place], [place] [address]    Special Requests   Final    BOTTLES DRAWN AEROBIC AND ANAEROBIC Blood Culture adequate volume Performed at [facility], [address] [place], [place] [address]    Culture   Final    NO GROWTH < 12 HOURS Performed at [facility], [place] [address] [place], [place] [address]    Report Status PENDING  Incomplete  MRSA PCR Screening     Status: None   Collection Time: 06/25/20 12:41 PM   Specimen: Nasal Mucosa; Nasopharyngeal  Result Value Ref Range Status   MRSA by PCR NEGATIVE NEGATIVE Final    Comment:        The GeneXpert MRSA Assay (FDA approved for NASAL specimens only), is one component of a comprehensive MRSA colonization surveillance program. It is not intended to diagnose MRSA infection nor to guide or monitor treatment for MRSA infections. Performed at [facility], [place] [address], [place], [place] [address]   Respiratory Panel by PCR     Status: None   Collection Time: 06/25/20 12:41 PM   Specimen: Nasopharyngeal Swab; Respiratory  Result Value Ref Range Status   Adenovirus NOT DETECTED NOT DETECTED Final   Coronavirus 229E NOT DETECTED NOT DETECTED Final    Comment: (NOTE) The Coronavirus on the Respiratory Panel, DOES NOT test for the novel  Coronavirus (2019 nCoV)    Coronavirus HKU1 NOT DETECTED NOT DETECTED Final   Coronavirus NL63 NOT DETECTED NOT DETECTED Final   Coronavirus OC43 NOT  DETECTED NOT DETECTED Final   Metapneumovirus NOT DETECTED NOT DETECTED Final   Rhinovirus / Enterovirus NOT DETECTED NOT DETECTED Final   Influenza A NOT DETECTED NOT DETECTED Final   Influenza B NOT DETECTED NOT DETECTED Final   Parainfluenza Virus 1 NOT DETECTED NOT DETECTED Final   Parainfluenza Virus 2 NOT DETECTED NOT DETECTED Final   Parainfluenza Virus 3 NOT DETECTED NOT DETECTED Final   Parainfluenza Virus 4 NOT DETECTED NOT DETECTED Final   Respiratory Syncytial Virus NOT DETECTED NOT DETECTED Final   Bordetella pertussis NOT DETECTED NOT DETECTED Final   Bordetella Parapertussis NOT DETECTED NOT DETECTED Final   Chlamydophila pneumoniae NOT DETECTED NOT DETECTED Final   Mycoplasma pneumoniae NOT DETECTED NOT DETECTED Final    Comment: Performed at [facility], [place], [place], [place] [address]         Radiology Studies: DG Shoulder 1V Right  Result Date: 06/25/2020 CLINICAL DATA:  Right shoulder pain EXAM: RIGHT SHOULDER - 1 VIEW COMPARISON:  01/31/2017 FINDINGS: There is no evidence of fracture or dislocation. Mild degenerative changes of the glenohumeral and acromioclavicular joints. Remote healed fracture of the right seventh rib. Soft tissues are unremarkable. IMPRESSION: Negative. Electronically Signed   By: Davina Poke D.O.   On: 06/25/2020 12:04   DG Wrist 2 Views Left  Result Date: 06/25/2020 CLINICAL DATA:  Bilateral wrist pain without trauma. EXAM:  LEFT WRIST - 2 VIEW COMPARISON:  None. FINDINGS: AP and lateral views. No acute fracture or dislocation. Osteopenia. Chondrocalcinosis in the triangular fibrocartilage. Degenerative changes including joint space narrowing and subchondral sclerosis involving the radiocarpal articulation and the radial aspect of the carpal bones. 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 IMPRESSION: Chondrocalcinosis within the triangular fibrocartilage, consistent with calcium pyrophosphate deposition  disease. 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 Electronically Signed   By: Abigail Miyamoto M.D.   On: 06/25/2020 12:05   DG Wrist 2 Views Right  Result Date: 06/25/2020 CLINICAL DATA:  Right wrist pain EXAM: RIGHT WRIST - 2 VIEW COMPARISON:  None. FINDINGS: Subtle cortical irregularity of the distal scaphoid pole along its radial cortex, likely degenerative spurring. Nondisplaced fracture not excluded on the included projections. Borderline widening of the scapholunate interval. Degenerative changes at the first [facility] and triscaphe joints status post trapeziectomy. Radiocarpal joint space narrowing. Chondrocalcinosis of the TFC. Mild soft tissue swelling. IMPRESSION: 1. Subtle cortical irregularity of the distal scaphoid pole along its radial cortex, likely degenerative spurring. Nondisplaced fracture not excluded on the included projections. Correlate with point tenderness. 2. Otherwise, no acute osseous findings. 3. Scapholunate interval is upper limits of normal which may reflect underlying ligamentous degeneration or tearing. Electronically Signed   By: Davina Poke D.O.   On: 06/25/2020 12:08   DG Chest Port 1 View  Result Date: 06/25/2020 CLINICAL DATA:  Sepsis. EXAM: PORTABLE CHEST 1 VIEW COMPARISON:  04/28/2020 FINDINGS: 0512 hours. Cardiopericardial silhouette is at upper limits of normal for size. The lungs are clear without focal pneumonia, edema, pneumothorax or pleural effusion. Interstitial markings are diffusely coarsened with chronic features. Nonacute rib fractures noted bilaterally. Telemetry leads overlie the chest. IMPRESSION: Chronic interstitial coarsening without acute cardiopulmonary findings. Electronically Signed   By: Misty Stanley M.D.   On: 06/25/2020 06:02        Scheduled Meds: . colchicine  0.6 mg Oral BID  . fluconazole  100 mg Oral Daily  . nystatin  5 mL Oral Q6H  . pantoprazole (PROTONIX) IV  40 mg Intravenous Q12H  . potassium  chloride  40 mEq Oral BID  . sucralfate  1 g Oral TID WC & HS   Continuous Infusions: . ceFEPime (MAXIPIME) IV 2 g (06/26/20 0506)  . doxycycline (VIBRAMYCIN) IV 100 mg (06/26/20 0928)     LOS: 0 days       Hosie Poisson, MD [facility]   To contact the attending provider between 7A-7P or the covering provider during after hours 7P-7A, please log into the web site [email or web address] and access using universal [place] password for that web site. If you do not have the password, please call the hospital operator.  06/26/2020, 9:45 AM

## 2020-06-26 NOTE — Consult Note (Signed)
ORTHOPAEDIC CONSULTATION  REQUESTING PHYSICIAN: Kathlen Mody, MD  PCP:  Patient, No Pcp Per  Chief Complaint: poly arthralgia  HPI: Sheila Davenport is a 74 y.o. female who complains of pretty significant polyarthralgia beginning yesterday morning very early.  She is accompanied by her husband who helps to provide some of the history given she is having some difficulty remembering the events leading up to the admission.  She does have a about a 69-month history of Crohn's disease and was initially started with treatment on prednisone.  She was taking 40 mg a day for the first month or 2, and then wean down to 20 mg a day.  She began infusion therapy earlier this month.  She then came off of the prednisone pretty acute fashion.  Since that time she is had complaints of polyarthralgias including bilateral shoulder, bilateral elbow, bilateral wrist as well as bilateral feet pain.  She is also had complaints of sore throat.  She came in with a low-grade fever and was treated empirically with wide spectrum antibiotics as well as sepsis work-up.  She does tell me that today, now greater than 24 hours later than presentation she does feel better.  She has had a couple of doses of antibiotics and otherwise has not been started back on the prednisone.  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  She otherwise has no known history of gouty arthropathy or otherwise.  I was consulted earlier today for query of the right wrist specifically.  Seem to be a little bit more swollen and tender on exam than the left.  Past Medical History:  Diagnosis Date  . Crohn disease (HCC)   . Diverticulosis   . Dyslipidemia   . GERD (gastroesophageal reflux disease)   . Hypertension   . Osteoporosis    Past Surgical History:  Procedure Laterality Date  . HAND SURGERY Right   . NASAL SINUS SURGERY    . REPLACEMENT TOTAL KNEE Right    Social History    Socioeconomic History  . Marital status: Married    Spouse name: Not on file  . Number of children: Not on file  . Years of education: Not on file  . Highest education level: Not on file  Occupational History  . Not on file  Tobacco Use  . Smoking status: Current Every Day Smoker  . Smokeless tobacco: Never Used  Substance and Sexual Activity  . Alcohol use: No  . Drug use: No  . Sexual activity: Not on file  Other Topics Concern  . Not on file  Social History Narrative  . Not on file   Social Determinants of Health   Financial Resource Strain: Not on file  Food Insecurity: Not on file  Transportation Needs: Not on file  Physical Activity: Not on file  Stress: Not on file  Social Connections: Not on file   Family History  Problem Relation Age of Onset  . Hypertension Other    Allergies  Allergen Reactions  . Azathioprine     Other reaction(s): Fever (intolerance)  . Levofloxacin Other (See Comments)    Unknown  . Nitrofuran Derivatives Other (See Comments)    Unknown  . Omeprazole Diarrhea  . Phenazopyridine Other (See Comments)    Unknown  . Decongest-Aid [Pseudoephedrine] Palpitations  . Loratadine Palpitations    Allergy to anithistamines  . Other Palpitations and Other (See Comments)    Antihistamine;  Reactions: Tachycardia Decongestant; Reactions: Tachycardia   Prior to Admission medications   Medication Sig Start Date End Date Taking? Authorizing Provider  Ascorbic Acid (VITAMIN C PO) Take 1-3 tablets by mouth See admin instructions. Takes 3 tablets in the morning and1 tablet at night   Yes [provider]  aspirin 81 MG tablet Take 81 mg by mouth daily.   Yes [provider]  atenolol (TENORMIN) 50 MG tablet Take 50 mg by mouth daily.   Yes [provider]  calcium-vitamin D (OSCAL WITH D) 500-200 MG-UNIT tablet Take 1 tablet by mouth daily.    Yes [provider]  Cholecalciferol (VITAMIN D PO) Take 2 capsules by  mouth daily.   Yes [provider]  CRANBERRY PO Take 2 capsules by mouth 2 (two) times daily.    Yes [provider]  ELDERBERRY PO Take 1-3 capsules by mouth See admin instructions. Takes 3 tablets in the morning and 1 at night   Yes [provider]  furosemide (LASIX) 20 MG tablet Take 20 mg by mouth daily.   Yes [provider]  inFLIXimab-dyyb (INFLECTRA IV) Inject 1 each into the vein every 14 (fourteen) days.   Yes [provider]  Multiple Vitamins-Minerals (MULTIVITAMIN WITH MINERALS) tablet Take 1 tablet by mouth daily.   Yes [provider]  pantoprazole (PROTONIX) 40 MG tablet Take 40 mg by mouth 2 (two) times daily. 10/28/19  Yes [provider]  predniSONE (DELTASONE) 10 MG tablet Take 10 mg by mouth 2 (two) times daily with a meal.   Yes [provider]  Probiotic Product (PROBIOTIC PO) Take 1 capsule by mouth daily.   Yes [provider]  Propylene Glycol (SYSTANE BALANCE OP) Place 2 drops into both eyes daily as needed (dry eyes).   Yes [provider]  rosuvastatin (CRESTOR) 10 MG tablet Take 10 mg by mouth daily. 11/18/19  Yes [provider]  sertraline (ZOLOFT) 100 MG tablet Take 100 mg by mouth daily.    Yes [provider]  telmisartan (MICARDIS) 40 MG tablet Take 40 mg by mouth daily. 10/19/19  Yes [provider]  estradiol (ESTRACE) 0.1 MG/GM vaginal cream Place 1 Applicatorful vaginally at bedtime. 06/15/20   [provider]  ondansetron (ZOFRAN ODT) 4 MG disintegrating tablet Take 1 tablet (4 mg total) by mouth every 4 (four) hours as needed for nausea or vomiting. Patient not taking: No sig reported 01/25/20   Arby Barrette, MD  ondansetron (ZOFRAN-ODT) 4 MG disintegrating tablet Take 2 tablets (8 mg total) by mouth every 8 (eight) hours as needed for nausea or vomiting. Patient not taking: No sig reported 08/23/16   Osvaldo Shipper, MD  valsartan  (DIOVAN) 160 MG tablet Take 1 tablet (160 mg total) by mouth daily. RESUME AFTER 3 DAYS Patient not taking: No sig reported 08/23/16   Osvaldo Shipper, MD   DG Shoulder 1V Right  Result Date: 06/25/2020 CLINICAL DATA:  Right shoulder pain EXAM: RIGHT SHOULDER - 1 VIEW COMPARISON:  01/31/2017 FINDINGS: There is no evidence of fracture or dislocation. Mild degenerative changes of the glenohumeral and acromioclavicular joints. Remote healed fracture of the right seventh rib. Soft tissues are unremarkable. IMPRESSION: Negative. Electronically Signed   By: Duanne Guess D.O.   On: 06/25/2020 12:04   DG Wrist 2 Views Left  Result Date: 06/25/2020 CLINICAL DATA:  Bilateral wrist pain without trauma. EXAM: LEFT WRIST - 2 VIEW COMPARISON:  None. FINDINGS: AP and lateral views.  No acute fracture or dislocation. Osteopenia. Chondrocalcinosis in the triangular fibrocartilage. Degenerative changes including joint space narrowing and subchondral sclerosis involving the radiocarpal articulation and the radial aspect of the carpal bones. 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 Electronically Signed   By: Jeronimo Greaves M.D.   On: 06/25/2020 12:05   DG Wrist 2 Views Right  Result Date: 06/25/2020 CLINICAL DATA:  Right wrist pain EXAM: RIGHT WRIST - 2 VIEW COMPARISON:  None. FINDINGS: Subtle cortical irregularity of the distal scaphoid pole along its radial cortex, likely degenerative spurring. Nondisplaced fracture not excluded on the included projections. Borderline widening of the scapholunate interval. Degenerative changes at the first [facility] and triscaphe joints status post trapeziectomy. Radiocarpal joint space narrowing. Chondrocalcinosis of the TFC. Mild soft tissue swelling. IMPRESSION: 1. Subtle cortical  irregularity of the distal scaphoid pole along its radial cortex, likely degenerative spurring. Nondisplaced fracture not excluded on the included projections. Correlate with point tenderness. 2. Otherwise, no acute osseous findings. 3. Scapholunate interval is upper limits of normal which may reflect underlying ligamentous degeneration or tearing. Electronically Signed   By: Duanne Guess D.O.   On: 06/25/2020 12:08   DG Chest Port 1 View  Result Date: 06/25/2020 CLINICAL DATA:  Sepsis. EXAM: PORTABLE CHEST 1 VIEW COMPARISON:  04/28/2020 FINDINGS: 0512 hours. Cardiopericardial silhouette is at upper limits of normal for size. The lungs are clear without focal pneumonia, edema, pneumothorax or pleural effusion. Interstitial markings are diffusely coarsened with chronic features. Nonacute rib fractures noted bilaterally. Telemetry leads overlie the chest. IMPRESSION: Chronic interstitial coarsening without acute cardiopulmonary findings. Electronically Signed   By: Kennith Center M.D.   On: 06/25/2020 06:02    Positive ROS: All other systems have been reviewed and were otherwise negative with the exception of those mentioned in the HPI and as above.  Physical Exam: General: Alert, no acute distress Cardiovascular: No pedal edema Respiratory: No cyanosis, no use of accessory musculature GI: No organomegaly, abdomen is soft and non-tender Skin: No lesions in the area of chief complaint Neurologic: Sensation intact distally Psychiatric: Patient is competent for consent with normal mood and affect Lymphatic: No axillary or cervical lymphadenopathy  MUSCULOSKELETAL:  Right upper extremity:  She has no pain with passive motion through the right wrist.  She does have some generalized edema about the distal forearm and wrist.  Mild warmth to the touch and no erythema.  Again, with passive motion she has no complaints of pain at the wrist.  Infection complains of pain at the shoulder.  She has  general tenderness to palpation about the shoulder.  Distally she is neurovascularly intact.  She does have evidence of scars along the wrist consistent with basal joint thumb surgery.  Assessment: Polyarthralgias Bilateral wrist pseudogout Right wrist pain  Plan: -At this time I do not feel as though this is an infectious arthropathy of the right wrist.  She has no pain with passive motion at the wrist and generalized swelling and nonfocal tenderness at the wrist joint itself.  -I wonder if she is not having some rebound arthralgias due to the 32-month history of daily prednisone.  I think it may be useful to place her back on her 10 mg twice daily prednisone dose for the next for 5 days.  If this does treat the arthralgias we could then wean her off in a more prolonged fashion.  -  Otherwise I think it would also be useful to have a follow-up CRP lab ordered in the morning.  This will give [place] a trend on the only lab that was elevated at her presentation.  -Should the shoulder pain continue to increase may be useful to look at that as a potential source, if her fever does not defervesce or if her clinical status deteriorates.  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 Thamas Jaegers in [facility].    Yolonda Kida, MD Cell [phone]    06/26/2020 4:09 PM

## 2020-06-26 NOTE — Evaluation (Signed)
Clinical/Bedside Swallow Evaluation Patient Details  Name: Sheila Davenport MRN: [id number] Date of Birth: 05/02/1946  Today's Date: 06/26/2020 Time: SLP Start Time (ACUTE ONLY): 1530 SLP Stop Time (ACUTE ONLY): 1546 SLP Time Calculation (min) (ACUTE ONLY): 16 min  Past Medical History:  Past Medical History:  Diagnosis Date  . Crohn disease (HCC)   . Diverticulosis   . Dyslipidemia   . GERD (gastroesophageal reflux disease)   . Hypertension   . Osteoporosis    Past Surgical History:  Past Surgical History:  Procedure Laterality Date  . HAND SURGERY Right   . NASAL SINUS SURGERY    . REPLACEMENT TOTAL KNEE Right    HPI:  Sheila Davenport is a 74 y.o. female with medical history significant of Crohn's disease, chronic immunosuppression, GERD, HLD, HTN admitted with  with bilateral wrist pain, bilateral shoulder pain and sore throat, dysphagia, generalized weakness, nausea, fever. MD questions oral candidias from chronic immunosuppresion. CXR Chronic interstitial coarsening without acute cardiopulmonary finding   Assessment / Plan / Recommendation Clinical Impression  Pt affirms odonophagia prior to admission due to medications causing oral and probable pharyngeal candidias. She hesitates before transiting orally but once swallow has initiated, there were no concerns of aspiration. Several times she appeared to prior to swallow. MD is treating candidias. Therapist upgraded texture to regular and husband will have more choices to order that pt can easily manipulate from oral standpoint. Educated to have her sit upright and eat only when adequately alert - she fell asleep several times mid sentence which husband states has been her recent baseline. Pills as she can tolerate- in puree may be easiet. No further ST is recommended for pt at this time. SLP Visit Diagnosis: Dysphagia, unspecified (R13.10)    Aspiration Risk  Mild aspiration risk    Diet Recommendation Regular;Thin liquid   Liquid  Administration via: Cup;Straw Medication Administration: Whole meds with puree Supervision: Staff to assist with self feeding;Full supervision/cueing for compensatory strategies Compensations: Slow rate;Small sips/bites Postural Changes: Seated upright at 90 degrees    Other  Recommendations Oral Care Recommendations: Oral care QID   Follow up Recommendations None      Frequency and Duration            Prognosis        Swallow Study   General Date of Onset: 06/25/20 HPI: Sheila Davenport is a 74 y.o. female with medical history significant of Crohn's disease, chronic immunosuppression, GERD, HLD, HTN admitted with  with bilateral wrist pain, bilateral shoulder pain and sore throat, dysphagia, generalized weakness, nausea, fever. MD questions oral candidias from chronic immunosuppresion. CXR Chronic interstitial coarsening without acute cardiopulmonary finding Type of Study: Bedside Swallow Evaluation Previous Swallow Assessment:  (none) Diet Prior to this Study: Other (Comment);Thin liquids (soft) Temperature Spikes Noted: No Respiratory Status: Nasal cannula History of Recent Intubation: No Behavior/Cognition: Pleasant mood;Cooperative;Lethargic/Drowsy;Requires cueing Oral Cavity Assessment: Other (comment) (lingual candidias) Oral Care Completed by SLP: Recent completion by staff Oral Cavity - Dentition: Adequate natural dentition Vision: Functional for self-feeding Self-Feeding Abilities: Needs assist (arm and wrist pain) Patient Positioning: Upright in bed Baseline Vocal Quality: Low vocal intensity Volitional Cough: Weak Volitional Swallow: Able to elicit    Oral/Motor/Sensory Function Overall Oral Motor/Sensory Function: Within functional limits   Ice Chips Ice chips: Not tested   Thin Liquid Thin Liquid: Within functional limits Presentation: Cup;Straw    Nectar Thick Nectar Thick Liquid: Within functional limits   Honey Thick Honey Thick Liquid: Not tested   Puree  Puree: Within functional limits   Solid     Solid: Not tested      Sheila Davenport 06/26/2020,4:02 PM    Sheila Davenport.Ed Nurse, children's [phone] Office [phone]

## 2020-06-27 ENCOUNTER — Inpatient Hospital Stay (HOSPITAL_COMMUNITY): Payer: Medicare HMO

## 2020-06-27 DIAGNOSIS — Z79899 Other long term (current) drug therapy: Secondary | ICD-10-CM | POA: Diagnosis not present

## 2020-06-27 DIAGNOSIS — D84821 Immunodeficiency due to drugs: Secondary | ICD-10-CM | POA: Diagnosis not present

## 2020-06-27 DIAGNOSIS — E876 Hypokalemia: Secondary | ICD-10-CM | POA: Diagnosis not present

## 2020-06-27 DIAGNOSIS — R531 Weakness: Secondary | ICD-10-CM | POA: Diagnosis not present

## 2020-06-27 LAB — BASIC METABOLIC PANEL
Anion gap: 8 (ref 5–15)
BUN: 18 mg/dL (ref 8–23)
CO2: 23 mmol/L (ref 22–32)
Calcium: 8.2 mg/dL — ABNORMAL LOW (ref 8.9–10.3)
Chloride: 107 mmol/L (ref 98–111)
Creatinine, Ser: 0.8 mg/dL (ref 0.44–1.00)
GFR, Estimated: >60 mL/min (ref >60)
Glucose, Bld: 156 mg/dL — ABNORMAL HIGH (ref 70–99)
Potassium: 3.7 mmol/L (ref 3.5–5.1)
Sodium: 138 mmol/L (ref 135–145)

## 2020-06-27 LAB — ROCKY MTN SPOTTED FVR ABS PNL(IGG+IGM)
RMSF IgG: NEGATIVE
RMSF IgM: 0.63 index (ref 0.00–0.89)

## 2020-06-27 MED ORDER — SODIUM CHLORIDE (PF) 0.9 % IJ SOLN
INTRAMUSCULAR | Status: AC
  Administered 2020-06-27: 10:00:00 10 mL
  Filled 2020-06-27: qty 10

## 2020-06-27 MED ORDER — PREDNISONE 5 MG PO TABS
10.0000 mg | ORAL_TABLET | Freq: Two times a day (BID) | ORAL | Status: AC
  Administered 2020-06-27 – 2020-06-29 (×5): 10 mg via ORAL
  Filled 2020-06-27 (×6): qty 2

## 2020-06-27 MED ORDER — PANTOPRAZOLE SODIUM 40 MG PO TBEC
40.0000 mg | DELAYED_RELEASE_TABLET | Freq: Two times a day (BID) | ORAL | Status: DC
  Administered 2020-06-27 – 2020-06-30 (×6): 40 mg via ORAL
  Filled 2020-06-27 (×6): qty 1

## 2020-06-27 NOTE — Evaluation (Signed)
Physical Therapy Evaluation Patient Details Name: Sheila Davenport MRN: [id number] DOB: April 05, 1946 Today's Date: 06/27/2020   History of Present Illness  74 yo female admitted with fever, polyarthralgia with specific R shoulder pain and dysfunction. Hx of Crohns disease, osteoporosis, R TKA, SIRS/sepsis, hypotension  Clinical Impression  On eval, pt required Mod-Max assist +2 for mobility. She sat EOB for several minutes with varying level of assistance needed to prevent LOB. She stood for ~20-30 seconds with 2 HHA. Pt was unable to take any steps on today. She c/o pain in her chest, R UE and hips. She was very drowsy during session. At this time, recommendation is for ST SNF for continued rehab. Will continue to follow and progress activity as tolerated.     Follow Up Recommendations SNF    Equipment Recommendations  None recommended by PT    Recommendations for Other Services       Precautions / Restrictions Precautions Precautions: Fall Restrictions Weight Bearing Restrictions: No      Mobility  Bed Mobility Overal bed mobility: Needs Assistance Bed Mobility: Supine to Sit;Sit to Supine     Supine to sit: +2 for physical assistance;+2 for safety/equipment;HOB elevated;Mod assist Sit to supine: Max assist;+2 for physical assistance;+2 for safety/equipment;HOB elevated   General bed mobility comments: Assist for trunk and bil LEs. Pt able to move LEs to EOB and use L UE on bedrail to assist. Utilized bedpad to aid with scooting, positioning. Increased time required.    Transfers Overall transfer level: Needs assistance Equipment used: 2 person hand held assist Transfers: Sit to/from Stand Sit to Stand: Mod assist;+2 safety/equipment;+2 physical assistance         General transfer comment: Assist to power up, stabilize, control descent. Pt able to stand statically but unable to take any steps.  Ambulation/Gait                Stairs            Wheelchair  Mobility    Modified Rankin (Stroke Patients Only)       Balance Overall balance assessment: Needs assistance Sitting-balance support: Single extremity supported;Feet supported   Sitting balance - Comments: Varying level of assistance: Min gaurd-Min assist. Repeated LOB posteriorly 2* drowsiness/falling asleep   Standing balance support: Bilateral upper extremity supported Standing balance-Leahy Scale: Poor                               Pertinent Vitals/Pain Pain Assessment: Faces Faces Pain Scale: Hurts whole lot Pain Location: chest, R UE, hips Pain Descriptors / Indicators: Grimacing;Guarding;Aching;Moaning Pain Intervention(s): Limited activity within patient's tolerance;Monitored during session;Repositioned    Home Living Family/patient expects to be discharged to:: Private residence Living Arrangements: Spouse/significant other;Children Available Help at Discharge: Family Type of Home: House Home Access: Stairs to enter   Secretary/administrator of Steps: 9-10 Home Layout: Laundry or work area in basement Home Equipment: Environmental consultant - 2 wheels;Cane - single point      Prior Function Level of Independence: Independent               Hand Dominance        Extremity/Trunk Assessment   Upper Extremity Assessment Upper Extremity Assessment: Defer to OT evaluation    Lower Extremity Assessment Lower Extremity Assessment: Generalized weakness (unable to completely assess due to drowsiness)    Cervical / Trunk Assessment Cervical / Trunk Assessment: Normal  Communication   Communication: No difficulties  Cognition Arousal/Alertness: Awake/alert (but drowsy-had difficulty keeping eyes open)   Overall Cognitive Status: Difficult to assess (2* drowsiness)                                        General Comments      Exercises     Assessment/Plan    PT Assessment Patient needs continued PT services  PT Problem List Decreased  strength;Decreased mobility;Decreased range of motion;Decreased activity tolerance;Decreased balance;Pain;Decreased knowledge of use of DME       PT Treatment Interventions DME instruction;Gait training;Therapeutic activities;Therapeutic exercise;Patient/family education;Balance training;Functional mobility training    PT Goals (Current goals can be found in the Care Plan section)  Acute Rehab PT Goals Patient Stated Goal: less pain PT Goal Formulation: With patient Time For Goal Achievement: 07/11/20 Potential to Achieve Goals: Good    Frequency Min 3X/week   Barriers to discharge        Co-evaluation               AM-PAC PT "6 Clicks" Mobility  Outcome Measure Help needed turning from your back to your side while in a flat bed without using bedrails?: A Lot Help needed moving from lying on your back to sitting on the side of a flat bed without using bedrails?: A Lot Help needed moving to and from a bed to a chair (including a wheelchair)?: A Lot Help needed standing up from a chair using your arms (e.g., wheelchair or bedside chair)?: A Lot Help needed to walk in hospital room?: A Lot Help needed climbing 3-5 steps with a railing? : Total 6 Click Score: 11    End of Session Equipment Utilized During Treatment: Gait belt Activity Tolerance: Patient limited by pain (limited by drowsiness) Patient left: in bed;with call bell/phone within reach;with bed alarm set   PT Visit Diagnosis: Muscle weakness (generalized) (M62.81);Difficulty in walking, not elsewhere classified (R26.2);Pain Pain - part of body: Shoulder;Hip    Time: 6440-3474 PT Time Calculation (min) (ACUTE ONLY): 17 min   Charges:   PT Evaluation $PT Eval Moderate Complexity: 1 Mod            Faye Ramsay, PT Acute Rehabilitation  Office: [phone] Pager: [phone]

## 2020-06-27 NOTE — Progress Notes (Signed)
PROGRESS NOTE    Sheila Davenport  EXH:[id number] DOB: [birth year] DOA: 06/25/2020 PCP: Patient, No Pcp Per    Chief Complaint  Patient presents with  . Sore Throat    Brief Narrative:   Sheila Davenport is a 74 y.o. female with medical history significant of Crohn's disease on prednisone and infliximab, on chronic immunosuppression,  GERD, HLD, HTN   Presents with bilateral wrist pain, bilateral shoulder pain and sore throat, dysphagia, generalized weakness, nausea, fever of 100.6. Blood cultures drawn and pending. Respiratory panel is negative. CXR, UA is negative for infection.   Assessment & Plan:   Active Problems:   Fever in adult   Polyarthralgia  Polymyalgia Polyarthralgia:  - ESR wnl, CRP is elevated.  ? Prednisone induces polymyositis. CK levels wnl.  Pain control.  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 Right wrist is swollen and tender, MRI of the wrist ordered for further evaluation.  Orthopedics consulted, suggested this could be rebound arthralgias due to 4 month history of prednisone, recommended to put her back on 10 mg BID for the next 5 days and see how she improves. Will recheck CRP in am.  Started the patient on colchicine. Pain control.  PT evaluation/ OT evaluation ordered recommended SNF.    Right wrist swelling:  ? Septic arthritis secondary to immunocompromised state. Mri wrist ordered.  Orthopedics will be consulted and recommendations give.    Fever of unclear etiology: Work up so far is negative. Currently on broad spectrum IV antibiotics.  Procalcitonin is negative, WBC count within normal limits.  Possibly DC IV cefepime if cultures are negative.  Rash on the palms, polyarthralgia, fever:  RS[facility] labs sent on admission.  She was started Doxycycline.    Dysphagia,  White coating of the tongue, painful oral cavity,  ? Oral candidiasis from chronic immunosuppresion Able to tolerate liquid  diet.  SLP eval ordered.  Advance diet as tolerated.  Nystatin, and oral diflucan ordered, along with sucralfate     GERD;  On PPI.    Hypokalemia:  Replaced.    Hypertension:  Well controlled. .    Crohn's disease Patient reports she is just coming off prednisone after a flareup. Infliximab on hold for now.    DVT prophylaxis: Lovenox.  Code Status: (Full code.  Family Communication: none at bedside.  Disposition:   Status is: Observation  The patient will require care spanning > 2 midnights and should be moved to inpatient because: Ongoing diagnostic testing needed not appropriate for outpatient work up and IV treatments appropriate due to intensity of illness or inability to take PO  Dispo: The patient is from: Home              Anticipated d/c is to: pending.               Anticipated d/c date is: 2 days              Patient currently is not medically stable to d/c.       Consultants:   None.  Procedures:None.  Antimicrobials:  Antibiotics Given (last 72 hours)    Date/Time Action Medication Dose Rate   06/25/20 0627 New Bag/Given   ceFEPIme (MAXIPIME) 2 g in sodium chloride 0.9 % 100 mL IVPB 2 g 200 mL/hr   06/25/20 0658 New Bag/Given   vancomycin (VANCOCIN) IVPB 1000 mg/200 mL premix 1,000 mg 200 mL/hr   06/25/20 1233 New Bag/Given   doxycycline (VIBRAMYCIN) 100  mg in sodium chloride 0.9 % 250 mL IVPB 100 mg 125 mL/hr   06/25/20 1516 New Bag/Given   ceFEPIme (MAXIPIME) 2 g in sodium chloride 0.9 % 100 mL IVPB 2 g 200 mL/hr   06/25/20 2224 New Bag/Given   ceFEPIme (MAXIPIME) 2 g in sodium chloride 0.9 % 100 mL IVPB 2 g 200 mL/hr   06/25/20 2319 New Bag/Given  [no additional iv access]   doxycycline (VIBRAMYCIN) 100 mg in sodium chloride 0.9 % 250 mL IVPB 100 mg 125 mL/hr   06/26/20 0506 New Bag/Given   ceFEPIme (MAXIPIME) 2 g in sodium chloride 0.9 % 100 mL IVPB 2 g 200 mL/hr   06/26/20 4098 New Bag/Given   doxycycline (VIBRAMYCIN) 100 mg in  sodium chloride 0.9 % 250 mL IVPB 100 mg 125 mL/hr   06/26/20 1450 New Bag/Given   ceFEPIme (MAXIPIME) 2 g in sodium chloride 0.9 % 100 mL IVPB 2 g 200 mL/hr   06/26/20 2147 New Bag/Given   ceFEPIme (MAXIPIME) 2 g in sodium chloride 0.9 % 100 mL IVPB 2 g 200 mL/hr   06/26/20 2219 New Bag/Given   doxycycline (VIBRAMYCIN) 100 mg in sodium chloride 0.9 % 250 mL IVPB 100 mg 125 mL/hr   06/27/20 0600 New Bag/Given   ceFEPIme (MAXIPIME) 2 g in sodium chloride 0.9 % 100 mL IVPB 2 g 200 mL/hr   06/27/20 1015 New Bag/Given   doxycycline (VIBRAMYCIN) 100 mg in sodium chloride 0.9 % 250 mL IVPB 100 mg 125 mL/hr   06/27/20 1412 New Bag/Given   ceFEPIme (MAXIPIME) 2 g in sodium chloride 0.9 % 100 mL IVPB 2 g 200 mL/hr       Subjective: She looks better this morning.  No chest pain or sob.   Objective: Vitals:   06/26/20 1230 06/26/20 2020 06/27/20 0441 06/27/20 1314  BP: 130/63 126/73 124/66 129/68  Pulse: 97 95 86 85  Resp: (!) $RemoveB'23 18 18 'ZIPQiKPD$ (!) 24  Temp: 98.6 F (37 C) 99.8 F (37.7 C) 99.1 F (37.3 C) 98.6 F (37 C)  TempSrc: Oral Oral Oral Oral  SpO2: 92% 96% 98% 93%  Weight:      Height:        Intake/Output Summary (Last 24 hours) at 06/27/2020 1637 Last data filed at 06/27/2020 0525 Gross per 24 hour  Intake --  Output 700 ml  Net -700 ml   Filed Weights   06/25/20 0441  Weight: 77.1 kg    Examination:  General exam: Appears calm and comfortable  Respiratory system: clear to auscultation, no wheezing heard.  Cardiovascular system: S1S2, RRR, no JVD, no pedal edema.  Gastrointestinal system: Abdomen is s oft, non tender non distended, bowel sounds wnl.  Central nervous system: Alert and oriented. Generalized weakness.  Extremities: painful ROM of all the extremities and joints. Skin: no rashes seen.  Psychiatry:. Mood is appropriate.      Data Reviewed: I have personally reviewed following labs and imaging studies  CBC: Recent Labs  Lab 06/25/20 0510  06/26/20 0516  WBC 9.3 7.1  NEUTROABS 6.7  --   HGB 14.3 13.9  HCT 41.9 42.3  MCV 96.8 99.5  PLT 118* PLATELET CLUMPS NOTED ON SMEAR, UNABLE TO ESTIMATE    Basic Metabolic Panel: Recent Labs  Lab 06/25/20 0510 06/25/20 1208 06/26/20 0516 06/26/20 0957 06/27/20 0433  NA 140  --  138  --  138  K 3.0*  --  3.0*  --  3.7  CL 103  --  102  --  107  CO2 27  --  25  --  23  GLUCOSE 92  --  108*  --  156*  BUN 17  --  10  --  18  CREATININE 0.82  --  0.73  --  0.80  CALCIUM 9.3  --  8.5*  --  8.2*  MG  --  2.0  --  1.7  --     GFR: Estimated Creatinine Clearance: 59.8 mL/min (by C-G formula based on SCr of 0.8 mg/dL).  Liver Function Tests: Recent Labs  Lab 06/25/20 0510 06/26/20 0516  AST 20 20  ALT 32 28  ALKPHOS 47 44  BILITOT 0.7 1.6*  PROT 5.9* 5.7*  ALBUMIN 3.5 3.1*    CBG: No results for input(s): GLUCAP in the last 168 hours.   Recent Results (from the past 240 hour(s))  Urine culture     Status: Abnormal   Collection Time: 06/25/20  5:00 AM   Specimen: In/Out Cath Urine  Result Value Ref Range Status   Specimen Description   Final    IN/OUT CATH URINE Performed at [facility], [address] [place], [place] [address]    Special Requests   Final    NONE Performed at [facility], [address] [place], [place] [address]    Culture MULTIPLE SPECIES PRESENT, SUGGEST RECOLLECTION (A)  Final   Report Status 06/26/2020 FINAL  Final  Group A Strep by PCR     Status: None   Collection Time: 06/25/20  5:06 AM   Specimen: Throat; Sterile Swab  Result Value Ref Range Status   Group A Strep by PCR NOT DETECTED NOT DETECTED Final    Comment: Performed at [facility], [address] [place], [place] [address]  Blood Culture (routine x 2)     Status: None (Preliminary result)   Collection Time: 06/25/20  5:10 AM   Specimen: BLOOD LEFT WRIST  Result Value Ref Range Status   Specimen Description   Final    BLOOD LEFT  WRIST Performed at [facility], [address] [place], [place] [address]    Special Requests   Final    BOTTLES DRAWN AEROBIC AND ANAEROBIC Blood Culture adequate volume Performed at [facility], [address] [place], [place] [address]    Culture   Final    NO GROWTH 2 DAYS Performed at [facility], [place] [address], [place], [place] [address]    Report Status PENDING  Incomplete  Resp Panel by RT-PCR (Flu A&B, Covid) Nasopharyngeal Swab     Status: None   Collection Time: 06/25/20  5:12 AM   Specimen: Nasopharyngeal Swab; Nasopharyngeal(NP) swabs in vial transport medium  Result Value Ref Range Status   SARS Coronavirus 2 by RT PCR NEGATIVE NEGATIVE Final    Comment: (NOTE) SARS-CoV-2 target nucleic acids are NOT DETECTED.  The SARS-CoV-2 RNA is generally detectable in upper respiratory specimens during the acute phase of infection. The lowest concentration of SARS-CoV-2 viral copies this assay can detect is 138 copies/mL. A negative result does not preclude SARS-Cov-2 infection and should not be used as the sole basis for treatment or other patient management decisions. A negative result may occur with  improper specimen collection/handling, submission of specimen other than nasopharyngeal swab, presence of viral mutation(s) within the areas targeted by this assay, and inadequate number of viral copies(<138 copies/mL). A  negative result must be combined with clinical observations, patient history, and epidemiological information. The expected result is Negative.  Fact Sheet for Patients:  EntrepreneurPulse.com.au  Fact Sheet for Healthcare Providers:  IncredibleEmployment.be  This test is no t yet approved or cleared by the [place] FDA and  has been authorized for detection and/or diagnosis of SARS-CoV-2 by FDA under an Emergency Use Authorization (EUA). This EUA will remain  in effect (meaning this  test can be used) for the duration of the COVID-19 declaration under Section 564(b)(1) of the Act, 21 U.S.C.section 360bbb-3(b)(1), unless the authorization is terminated  or revoked sooner.       Influenza A by PCR NEGATIVE NEGATIVE Final   Influenza B by PCR NEGATIVE NEGATIVE Final    Comment: (NOTE) The Xpert Xpress SARS-CoV-2/FLU/RSV plus assay is intended as an aid in the diagnosis of influenza from Nasopharyngeal swab specimens and should not be used as a sole basis for treatment. Nasal washings and aspirates are unacceptable for Xpert Xpress SARS-CoV-2/FLU/RSV testing.  Fact Sheet for Patients: EntrepreneurPulse.com.au  Fact Sheet for Healthcare Providers: IncredibleEmployment.be  This test is not yet approved or cleared by the [place] FDA and has been authorized for detection and/or diagnosis of SARS-CoV-2 by FDA under an Emergency Use Authorization (EUA). This EUA will remain in effect (meaning this test can be used) for the duration of the COVID-19 declaration under Section 564(b)(1) of the Act, 21 U.S.C. section 360bbb-3(b)(1), unless the authorization is terminated or revoked.  Performed at [facility], [address] [place], [place] [address]   Blood Culture (routine x 2)     Status: None (Preliminary result)   Collection Time: 06/25/20  5:29 AM   Specimen: BLOOD RIGHT FOREARM  Result Value Ref Range Status   Specimen Description   Final    BLOOD RIGHT FOREARM Performed at [facility], [address] [place], [place] [address]    Special Requests   Final    BOTTLES DRAWN AEROBIC AND ANAEROBIC Blood Culture adequate volume Performed at [facility], [address] [place], [place] [address]    Culture   Final    NO GROWTH 2 DAYS Performed at [facility], [place] [address] [place], [place] [address]    Report Status PENDING  Incomplete  MRSA PCR Screening     Status: None    Collection Time: 06/25/20 12:41 PM   Specimen: Nasal Mucosa; Nasopharyngeal  Result Value Ref Range Status   MRSA by PCR NEGATIVE NEGATIVE Final    Comment:        The GeneXpert MRSA Assay (FDA approved for NASAL specimens only), is one component of a comprehensive MRSA colonization surveillance program. It is not intended to diagnose MRSA infection nor to guide or monitor treatment for MRSA infections. Performed at [facility], [place] [address], [place], [place] [address]   Respiratory Panel by PCR     Status: None   Collection Time: 06/25/20 12:41 PM   Specimen: Nasopharyngeal Swab; Respiratory  Result Value Ref Range Status   Adenovirus NOT DETECTED NOT DETECTED Final   Coronavirus 229E NOT DETECTED NOT DETECTED Final    Comment: (NOTE) The Coronavirus on the Respiratory Panel, DOES NOT test for the novel  Coronavirus (2019 nCoV)    Coronavirus HKU1 NOT DETECTED NOT DETECTED Final   Coronavirus NL63 NOT DETECTED NOT DETECTED Final   Coronavirus OC43 NOT DETECTED NOT DETECTED Final  Metapneumovirus NOT DETECTED NOT DETECTED Final   Rhinovirus / Enterovirus NOT DETECTED NOT DETECTED Final   Influenza A NOT DETECTED NOT DETECTED Final   Influenza B NOT DETECTED NOT DETECTED Final   Parainfluenza Virus 1 NOT DETECTED NOT DETECTED Final   Parainfluenza Virus 2 NOT DETECTED NOT DETECTED Final   Parainfluenza Virus 3 NOT DETECTED NOT DETECTED Final   Parainfluenza Virus 4 NOT DETECTED NOT DETECTED Final   Respiratory Syncytial Virus NOT DETECTED NOT DETECTED Final   Bordetella pertussis NOT DETECTED NOT DETECTED Final   Bordetella Parapertussis NOT DETECTED NOT DETECTED Final   Chlamydophila pneumoniae NOT DETECTED NOT DETECTED Final   Mycoplasma pneumoniae NOT DETECTED NOT DETECTED Final    Comment: Performed at [facility], [place] [address], [place], [place] [address]         Radiology Studies: No results found.      Scheduled Meds: .  colchicine  0.6 mg Oral BID  . fluconazole  100 mg Oral Daily  . mouth rinse  15 mL Mouth Rinse BID  . nystatin  5 mL Oral Q6H  . pantoprazole  40 mg Oral BID  . potassium chloride  40 mEq Oral BID  . predniSONE  10 mg Oral BID WC  . sertraline  100 mg Oral Daily  . sucralfate  1 g Oral TID WC & HS   Continuous Infusions: . ceFEPime (MAXIPIME) IV 2 g (06/27/20 1412)  . doxycycline (VIBRAMYCIN) IV 100 mg (06/27/20 1015)     LOS: 1 day       Hosie Poisson, MD [facility]   To contact the attending provider between 7A-7P or the covering provider during after hours 7P-7A, please log into the web site [email or web address] and access using universal [place] password for that web site. If you do not have the password, please call the hospital operator.  06/27/2020, 4:37 PM

## 2020-06-27 NOTE — Progress Notes (Signed)
Occupational Therapy Evaluation  Patient lives with spouse and DTR in two story house with 12-14 STE. Patient reports at baseline she is I, helps take care of her grandchildren. Currently patient requiring mod A x2 for bed mobility and sit to stand from EOB with increased difficulty weight shifting/trying to side step to [facility]. Patient with decreased alertness, closing her eyes frequently throughout session and requiring mod to max cues to initiate tasks. Recommend continued acute OT services to maximize patient safety and independence with self care in order to facilitate D/C to venue listed below.    06/27/20 1328  OT Visit Information  Last OT Received On 06/27/20  Assistance Needed +2  History of Present Illness 74 yo female admitted with fever, polyarthralgia with specific R shoulder pain and dysfunction. Hx of Crohns disease, osteoporosis, R TKA, SIRS/sepsis, hypotension  Precautions  Precautions Fall  Restrictions  Weight Bearing Restrictions No  Home Living  Family/patient expects to be discharged to: Private residence  Living Arrangements Spouse/significant other;Children  Available Help at Discharge Family  Type of Home House  Home Access Stairs to enter  Entrance Stairs-Number of Steps 12-14  Home Layout Laundry or work area in basement  Home Equipment [place] - 2 wheels;Cane - single point  Prior Function  Level of Independence Independent  Communication  Communication No difficulties  Pain Assessment  Pain Assessment Faces  Faces Pain Scale 8  Pain Location chest, R UE, hips  Pain Descriptors / Indicators Grimacing;Guarding;Aching;Moaning  Pain Intervention(s) Premedicated before session  Cognition  Arousal/Alertness Lethargic  Behavior During Therapy WFL for tasks assessed/performed  Overall Cognitive Status Difficult to assess  General Comments upon arrival patient sleeping with her breakfast in front of her, had difficulty throughout evaluation maintaining alertness  closing her eyes and taking increased time to answer all questions. requires increased time to initiate mobility  Difficult to assess due to Level of arousal  Upper Extremity Assessment  Upper Extremity Assessment RUE deficits/detail;Generalized weakness;LUE deficits/detail  RUE Deficits / Details significantly limited shoulder ROM d/t pain flexion ~10 degrees and elbow flex ~90 degrees  RUE Unable to fully assess due to pain  LUE Deficits / Details AROM grossly intact  Lower Extremity Assessment  Lower Extremity Assessment Defer to PT evaluation  Cervical / Trunk Assessment  Cervical / Trunk Assessment Normal  ADL  Overall ADL's  Needs assistance/impaired  Eating/Feeding Set up;Bed level  Eating/Feeding Details (indicate cue type and reason) patient is able to eat some of her eggs however is falling asleep with tray in front of her  Grooming Wash/dry face;Set up  Grooming Details (indicate cue type and reason) verbal cues to complete task as patient is distractible and falling asleep  Upper Body Bathing Minimal assistance;Sitting  Lower Body Bathing Maximal assistance;Sitting/lateral leans  Upper Body Dressing  Minimal assistance;Sitting  Lower Body Dressing Maximal assistance;Bed level;Sitting/lateral leans  Lower Body Dressing Details (indicate cue type and reason) to don socks  Toilet Transfer Moderate assistance;+2 for physical assistance;+2 for safety/equipment;Cueing for sequencing  Toilet Transfer Details (indicate cue type and reason) patient require x2 assist to power up to standing, unable to pick up feet to take steps, does attempt to slide feet towards [facility] with mod cues however unable to mobilize to [facility]  Toileting- Clothing Manipulation and Hygiene Total assistance;Sit to/from stand;Sitting/lateral lean  Functional mobility during ADLs Moderate assistance;+2 for physical assistance;+2 for safety/equipment;Cueing for safety;Cueing for sequencing  General ADL Comments patient  requiring increased assistance with self care tasks due to  decreased cognition, safety awareness, strength, ROM, activity tolerance, balance  Bed Mobility  Overal bed mobility Needs Assistance  Bed Mobility Supine to Sit;Sit to Supine  Supine to sit +2 for physical assistance;+2 for safety/equipment;HOB elevated;Mod assist  Sit to supine Max assist;+2 for physical assistance;+2 for safety/equipment;HOB elevated  General bed mobility comments with increased time + cues patient does slide LEs toward EOB with mod A x1-2 to sit trunk upright. despite cues patient does not initiate lying back onto bed requiring max A x2  Transfers  Overall transfer level Needs assistance  Equipment used 2 person hand held assist  Transfers Sit to/from Stand  Sit to Stand Mod assist;+2 safety/equipment;+2 physical assistance  General transfer comment please see toilet transfer in ADL section  Balance  Overall balance assessment Needs assistance  Sitting-balance support Single extremity supported;Feet supported  Sitting balance-Leahy Scale Poor  Sitting balance - Comments Varying level of assistance: Min gaurd-Min assist. Repeated LOB posteriorly 2* drowsiness/falling asleep  Standing balance support Bilateral upper extremity supported  Standing balance-Leahy Scale Poor  Standing balance comment reliant on external assist  OT - End of Session  Activity Tolerance Patient limited by lethargy  Patient left in bed;with call bell/phone within reach;with bed alarm set  Nurse Communication Mobility status  OT Assessment  OT Recommendation/Assessment Patient needs continued OT Services  OT Visit Diagnosis Other abnormalities of gait and mobility (R26.89);Unsteadiness on feet (R26.81);Muscle weakness (generalized) (M62.81);Pain  Pain - Right/Left Right  Pain - part of body Shoulder  OT Problem List Decreased strength;Decreased range of motion;Decreased activity tolerance;Impaired balance (sitting and/or  standing);Decreased safety awareness;Pain;Impaired UE functional use;Obesity  OT Plan  OT Frequency (ACUTE ONLY) Min 2X/week  OT Treatment/Interventions (ACUTE ONLY) Self-care/ADL training;Therapeutic exercise;Energy conservation;DME and/or AE instruction;Therapeutic activities;Patient/family education;Balance training;Cognitive remediation/compensation  AM-PAC OT "6 Clicks" Daily Activity Outcome Measure (Version 2)  Help from another person eating meals? 3  Help from another person taking care of personal grooming? 3  Help from another person toileting, which includes using toliet, bedpan, or urinal? 2  Help from another person bathing (including washing, rinsing, drying)? 2  Help from another person to put on and taking off regular upper body clothing? 3  Help from another person to put on and taking off regular lower body clothing? 2  6 Click Score 15  OT Recommendation  Follow Up Recommendations SNF  OT Equipment 3 in 1 bedside commode  Individuals Consulted  Consulted and Agree with Results and Recommendations Patient  Acute Rehab OT Goals  Patient Stated Goal less pain  OT Goal Formulation With patient  Time For Goal Achievement 07/11/20  Potential to Achieve Goals Good  OT Time Calculation  OT Start Time (ACUTE ONLY) 1006  OT Stop Time (ACUTE ONLY) 1037  OT Time Calculation (min) 31 min  OT General Charges  $OT Visit 1 Visit  OT Evaluation  $OT Eval Moderate Complexity 1 Mod  Written Expression  Dominant Hand Right   Marlyce Huge OT OT pager: [phone]

## 2020-06-28 DIAGNOSIS — E876 Hypokalemia: Secondary | ICD-10-CM | POA: Diagnosis not present

## 2020-06-28 DIAGNOSIS — R531 Weakness: Secondary | ICD-10-CM | POA: Diagnosis not present

## 2020-06-28 DIAGNOSIS — Z79899 Other long term (current) drug therapy: Secondary | ICD-10-CM | POA: Diagnosis not present

## 2020-06-28 DIAGNOSIS — D84821 Immunodeficiency due to drugs: Secondary | ICD-10-CM | POA: Diagnosis not present

## 2020-06-28 NOTE — Plan of Care (Signed)
Plan of care reviewed and discussed with the patient. 

## 2020-06-28 NOTE — Progress Notes (Signed)
MRI wrist reviewed.  Inflammation extensor tendons noted and small fluid in DRUJ.  No wrist effusion and no indication for surgery.  Continue with symptomatic treatment for polyarthralgias.

## 2020-06-28 NOTE — TOC Initial Note (Signed)
Transition of Care [facility]) - Initial/Assessment Note    Patient Details  Name: Sheila Davenport MRN: [id number] Date of Birth: 1945-11-28  Transition of Care [facility]) CM/SW Contact:    Armanda Heritage, RN Phone Number: 06/28/2020, 2:31 PM  Clinical Narrative:                 CM spoke with patient re: recommendation for SNF for short term rehab.  Patient reports she would like to go home instead of SNF.  Patient lives with spouse and daughter and states they can assist her at home, reports has RW.  [employer] rep Jermaine given referral for dme 3in1. [facility] to provide HHPT/OT.   Expected Discharge Plan: Home w Home Health Services Barriers to Discharge: Continued Medical Work up   Patient Goals and CMS Choice Patient states their goals for this hospitalization and ongoing recovery are:: wishes to return home with therapy instead of going to rehab facility CMS Medicare.gov Compare Post Acute Care list provided to:: Patient Choice offered to / list presented to : Patient  Expected Discharge Plan and Services Expected Discharge Plan: Home w Home Health Services   Discharge Planning Services: CM Consult Post Acute Care Choice: Home Health Living arrangements for the past 2 months: Single Family Home                           HH Arranged: PT,OT HH Agency: [facility] Date [facility] Agency Contacted: 06/28/20 Time HH Agency Contacted: 1430 Representative spoke with at [facility] Agency: Kandee Keen  Prior Living Arrangements/Services Living arrangements for the past 2 months: Single Family Home Lives with:: Spouse,Adult Children Patient language and need for interpreter reviewed:: Yes Do you feel safe going back to the place where you live?: Yes      Need for Family Participation in Patient Care: Yes (Comment) Care giver support system in place?: Yes (comment)   Criminal Activity/Legal Involvement Pertinent to Current Situation/Hospitalization: No - Comment as needed  Activities of Daily  Living Home Assistive Devices/Equipment: None ADL Screening (condition at time of admission) Patient's cognitive ability adequate to safely complete daily activities?: Yes Is the patient deaf or have difficulty hearing?: No Does the patient have difficulty seeing, even when wearing glasses/contacts?: No Does the patient have difficulty concentrating, remembering, or making decisions?: No Patient able to express need for assistance with ADLs?: Yes Does the patient have difficulty dressing or bathing?: Yes Independently performs ADLs?: No Communication: Independent Dressing (OT): Needs assistance Is this a change from baseline?: Pre-admission baseline Grooming: Needs assistance Is this a change from baseline?: Pre-admission baseline Feeding: Independent Bathing: Needs assistance Is this a change from baseline?: Pre-admission baseline In/Out Bed: Needs assistance Is this a change from baseline?: Pre-admission baseline Walks in Home: Independent Does the patient have difficulty walking or climbing stairs?: Yes Weakness of Legs: Both Weakness of Arms/Hands: Both  Permission Sought/Granted                  Emotional Assessment Appearance:: Appears stated age Attitude/Demeanor/Rapport: Engaged Affect (typically observed): Accepting Orientation: : Oriented to Self,Oriented to Place,Oriented to  Time,Oriented to Situation   Psych Involvement: No (comment)  Admission diagnosis:  Hypokalemia [E87.6] Polyarthralgia [M25.50] Sore throat [J02.9] Thrombocytopenia (HCC) [D69.6] Generalized weakness [R53.1] Febrile illness [R50.9] Fever in adult [R50.9] 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 Polyarthralgia 06/26/2020  . Fever in adult 06/25/2020  .  SIRS (systemic inflammatory response syndrome) (HCC) 08/20/2016  . Diverticulitis of intestine without bleeding 08/20/2016  . Acute diverticulitis  08/20/2016  . Fever 08/19/2016   PCP:  Patient, No Pcp Per Pharmacy:   [id number] - [place], [place] - [address] AT [facility] [address] [place] [address] Phone: [phone] Fax: [phone]     Social Determinants of Health (SDOH) Interventions    Readmission Risk Interventions No flowsheet data found.

## 2020-06-28 NOTE — Care Management Obs Status (Signed)
MEDICARE OBSERVATION STATUS NOTIFICATION   Patient Details  Name: Etna Forquer MRN: [id number] Date of Birth: [birth year]   Medicare Observation Status Notification Given:  Yes    Armanda Heritage, RN 06/28/2020, 2:24 PM

## 2020-06-28 NOTE — Care Management CC44 (Signed)
Condition Code 44 Documentation Completed  Patient Details  Name: Sheila Davenport MRN: [id number] Date of Birth: 01-May-1946   Condition Code 44 given:  Yes Patient signature on Condition Code 44 notice:  Yes Documentation of 2 MD's agreement:  Yes Code 44 added to claim:  Yes    Armanda Heritage, RN 06/28/2020, 2:24 PM

## 2020-06-28 NOTE — Progress Notes (Signed)
PROGRESS NOTE    Sheila Davenport  OZY:[id number] DOB: January 20, 1946 DOA: 06/25/2020 PCP: Patient, No Pcp Per    Chief Complaint  Patient presents with  . Sore Throat    Brief Narrative:   Sheila Davenport is a 74 y.o. female with medical history significant of Crohn's disease on prednisone and infliximab, on chronic immunosuppression,  GERD, HLD, HTN   Presents with bilateral wrist pain, bilateral shoulder pain and sore throat, dysphagia, generalized weakness, nausea, fever of 100.6. Blood cultures drawn and pending. Respiratory panel is negative. CXR, UA is negative for infection.   Assessment & Plan:   Active Problems:   Fever in adult   Polyarthralgia  Polymyalgia Polyarthralgia:  - ESR wnl, CRP is elevated.   CK levels wnl.  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 Right wrist is swollen and tender, MRI of the wrist ordered and is negative for fracture . It shows Small cystic degenerative changes are seen throughout the carpals. Mild extensor compartment 1, 2, and extensor carpi ulnaris tenosynovitis.  Small radioulnar joint effusion Orthopedics consulted, suggested this could be rebound arthralgias due to 4 month history of prednisone use, recommended to put her back on 10 mg BID for the next 5 days and see if she improves. Recommend slow taper of the prednisone on discharge.  Will recheck CRP in am.  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 Pain control.  PT evaluation/ OT evaluation ordered recommended SNF.  Pt refusing SNF at this time, and would liek to go home with home health.    Right wrist swelling:  Initially suspected Septic arthritis secondary to immunocompromised state. Mri wrist ordered and is negative. .  Orthopedics will be consulted and recommendations given. The swelling and tenderness have improved with prednisone and colchicine.    Fever of unclear etiology: Work up so far is  negative. Currently on broad spectrum IV antibiotics, please d/c IV cefepime if cultures remain negative for 48 hours.  Procalcitonin is negative, WBC count within normal limits.   Rash on the palms, polyarthralgia, fever:  RSMF labs sent on admission and are negative. D/c doxycycline.     Dysphagia,  White coating of the tongue, painful oral cavity,  ? Oral candidiasis from chronic immunosuppresion Able to tolerate liquid diet.  Advance diet as tolerated.  Nystatin, and oral diflucan ordered, along with sucralfate     GERD;  On PPI.    Hypokalemia:  Replaced.    Hypertension:  Well controlled. .    Crohn's disease Patient reports she is just coming off prednisone after a flareup. Infliximab on hold for now.    DVT prophylaxis: Lovenox.  Code Status: (Full code.  Family Communication: discussed with husband at bedside.  Disposition:   Status is: Observation  The patient will require care spanning > 2 midnights and should be moved to inpatient because: Ongoing diagnostic testing needed not appropriate for outpatient work up and IV treatments appropriate due to intensity of illness or inability to take PO  Dispo: The patient is from: Home              Anticipated d/c is to: Home              Anticipated d/c date is: 1 day              Patient currently is not medically stable to d/c.       Consultants:  Orthopedics.   Procedures:None.  Antimicrobials:  Antibiotics Given (last 72 hours)    Date/Time Action Medication Dose Rate   06/25/20 2224 New Bag/Given   ceFEPIme (MAXIPIME) 2 g in sodium chloride 0.9 % 100 mL IVPB 2 g 200 mL/hr   06/25/20 2319 New Bag/Given  [no additional iv access]   doxycycline (VIBRAMYCIN) 100 mg in sodium chloride 0.9 % 250 mL IVPB 100 mg 125 mL/hr   06/26/20 0506 New Bag/Given   ceFEPIme (MAXIPIME) 2 g in sodium chloride 0.9 % 100 mL IVPB 2 g 200 mL/hr   06/26/20 4081 New Bag/Given   doxycycline (VIBRAMYCIN) 100 mg in sodium  chloride 0.9 % 250 mL IVPB 100 mg 125 mL/hr   06/26/20 1450 New Bag/Given   ceFEPIme (MAXIPIME) 2 g in sodium chloride 0.9 % 100 mL IVPB 2 g 200 mL/hr   06/26/20 2147 New Bag/Given   ceFEPIme (MAXIPIME) 2 g in sodium chloride 0.9 % 100 mL IVPB 2 g 200 mL/hr   06/26/20 2219 New Bag/Given   doxycycline (VIBRAMYCIN) 100 mg in sodium chloride 0.9 % 250 mL IVPB 100 mg 125 mL/hr   06/27/20 0600 New Bag/Given   ceFEPIme (MAXIPIME) 2 g in sodium chloride 0.9 % 100 mL IVPB 2 g 200 mL/hr   06/27/20 1015 New Bag/Given   doxycycline (VIBRAMYCIN) 100 mg in sodium chloride 0.9 % 250 mL IVPB 100 mg 125 mL/hr   06/27/20 1412 New Bag/Given   ceFEPIme (MAXIPIME) 2 g in sodium chloride 0.9 % 100 mL IVPB 2 g 200 mL/hr   06/27/20 2214 New Bag/Given   ceFEPIme (MAXIPIME) 2 g in sodium chloride 0.9 % 100 mL IVPB 2 g 200 mL/hr   06/27/20 2320 New Bag/Given   doxycycline (VIBRAMYCIN) 100 mg in sodium chloride 0.9 % 250 mL IVPB 100 mg 125 mL/hr   06/28/20 4481 New Bag/Given   ceFEPIme (MAXIPIME) 2 g in sodium chloride 0.9 % 100 mL IVPB 2 g 200 mL/hr   06/28/20 0930 New Bag/Given   doxycycline (VIBRAMYCIN) 100 mg in sodium chloride 0.9 % 250 mL IVPB 100 mg 125 mL/hr   06/28/20 1313 New Bag/Given   ceFEPIme (MAXIPIME) 2 g in sodium chloride 0.9 % 100 mL IVPB 2 g 200 mL/hr       Subjective: She reports she feels 100% better. But not back to baseline yet. Adamantly rfusing SNF.  Objective: Vitals:   06/27/20 1817 06/27/20 2156 06/28/20 0513 06/28/20 1430  BP: (!) 130/59 95/60 130/67 125/68  Pulse: 87 97 77 79  Resp: _0 (!) 24  Temp: 99 F (37.2 C) 99.4 F (37.4 C) 98.9 F (37.2 C) 99.3 F (37.4 C)  TempSrc: Oral Oral Oral Oral  SpO2: 97% 97% 95% 91%  Weight:      Height:        Intake/Output Summary (Last 24 hours) at 06/28/2020 1558 Last data filed at 06/28/2020 1400 Gross per 24 hour  Intake 830 ml  Output 250 ml  Net 580 ml   Filed Weights   06/25/20 0441  Weight: 77.1 kg     Examination:  General exam: Alert and comfortable, not in any distress.  Respiratory system: To auscultation bilaterally, no wheezing or rhonchi Cardiovascular system: S1-S2 heard, regular rate rhythm, no JVD, no pedal edema Gastrointestinal system: Abdomen is soft, nontender nondistended bowel sounds normal Central nervous system: Alert and oriented, generalized weakness requiring 2+ assist Extremities:  decreased pain with range of movements of the upper and lower extremities Skin:  no rashes seen Psychiatry:.  Mood is appropriate    Data Reviewed: I have personally reviewed following labs and imaging studies  CBC: Recent Labs  Lab 06/25/20 0510 06/26/20 0516  WBC 9.3 7.1  NEUTROABS 6.7  --   HGB 14.3 13.9  HCT 41.9 42.3  MCV 96.8 99.5  PLT 118* PLATELET CLUMPS NOTED ON SMEAR, UNABLE TO ESTIMATE    Basic Metabolic Panel: Recent Labs  Lab 06/25/20 0510 06/25/20 1208 06/26/20 0516 06/26/20 0957 06/27/20 0433  NA 140  --  138  --  138  K 3.0*  --  3.0*  --  3.7  CL 103  --  102  --  107  CO2 27  --  25  --  23  GLUCOSE 92  --  108*  --  156*  BUN 17  --  10  --  18  CREATININE 0.82  --  0.73  --  0.80  CALCIUM 9.3  --  8.5*  --  8.2*  MG  --  2.0  --  1.7  --     GFR: Estimated Creatinine Clearance: 59.8 mL/min (by C-G formula based on SCr of 0.8 mg/dL).  Liver Function Tests: Recent Labs  Lab 06/25/20 0510 06/26/20 0516  AST 20 20  ALT 32 28  ALKPHOS 47 44  BILITOT 0.7 1.6*  PROT 5.9* 5.7*  ALBUMIN 3.5 3.1*    CBG: No results for input(s): GLUCAP in the last 168 hours.   Recent Results (from the past 240 hour(s))  Urine culture     Status: Abnormal   Collection Time: 06/25/20  5:00 AM   Specimen: In/Out Cath Urine  Result Value Ref Range Status   Specimen Description   Final    IN/OUT CATH URINE Performed at [facility], [address] [place], [place] [address]    Special Requests   Final    NONE Performed at [facility], [address] [place], [place] [address]    Culture MULTIPLE SPECIES PRESENT, SUGGEST RECOLLECTION (A)  Final   Report Status 06/26/2020 FINAL  Final  Group A Strep by PCR     Status: None   Collection Time: 06/25/20  5:06 AM   Specimen: Throat; Sterile Swab  Result Value Ref Range Status   Group A Strep by PCR NOT DETECTED NOT DETECTED Final    Comment: Performed at [facility], [address] [place], [place] [address]  Blood Culture (routine x 2)     Status: None (Preliminary result)   Collection Time: 06/25/20  5:10 AM   Specimen: BLOOD LEFT WRIST  Result Value Ref Range Status   Specimen Description   Final    BLOOD LEFT WRIST Performed at [facility], [address] [place], [place] [address]    Special Requests   Final    BOTTLES DRAWN AEROBIC AND ANAEROBIC Blood Culture adequate volume Performed at [facility], [address] [place], [place] [address]    Culture   Final    NO GROWTH 3 DAYS Performed at [facility], [place] [address] [place], [place] [address]    Report Status PENDING  Incomplete  Resp Panel by RT-PCR (Flu A&B, Covid) Nasopharyngeal Swab     Status: None   Collection Time: 06/25/20  5:12 AM   Specimen: Nasopharyngeal Swab; Nasopharyngeal(NP) swabs in vial transport medium  Result Value Ref Range Status   SARS Coronavirus 2 by  RT PCR NEGATIVE NEGATIVE Final    Comment: (NOTE) SARS-CoV-2 target nucleic acids are NOT DETECTED.  The SARS-CoV-2 RNA is generally detectable in upper respiratory specimens during the acute phase of infection. The lowest concentration of SARS-CoV-2 viral copies this assay can detect is 138 copies/mL. A negative result does not preclude SARS-Cov-2 infection and should not be used as the sole basis for treatment or other patient management decisions. A negative result may occur with  improper specimen collection/handling, submission of specimen other than nasopharyngeal  swab, presence of viral mutation(s) within the areas targeted by this assay, and inadequate number of viral copies(<138 copies/mL). A negative result must be combined with clinical observations, patient history, and epidemiological information. The expected result is Negative.  Fact Sheet for Patients:  EntrepreneurPulse.com.au  Fact Sheet for Healthcare Providers:  IncredibleEmployment.be  This test is no t yet approved or cleared by the [place] FDA and  has been authorized for detection and/or diagnosis of SARS-CoV-2 by FDA under an Emergency Use Authorization (EUA). This EUA will remain  in effect (meaning this test can be used) for the duration of the COVID-19 declaration under Section 564(b)(1) of the Act, 21 U.S.C.section 360bbb-3(b)(1), unless the authorization is terminated  or revoked sooner.       Influenza A by PCR NEGATIVE NEGATIVE Final   Influenza B by PCR NEGATIVE NEGATIVE Final    Comment: (NOTE) The Xpert Xpress SARS-CoV-2/FLU/RSV plus assay is intended as an aid in the diagnosis of influenza from Nasopharyngeal swab specimens and should not be used as a sole basis for treatment. Nasal washings and aspirates are unacceptable for Xpert Xpress SARS-CoV-2/FLU/RSV testing.  Fact Sheet for Patients: EntrepreneurPulse.com.au  Fact Sheet for Healthcare Providers: IncredibleEmployment.be  This test is not yet approved or cleared by the [place] FDA and has been authorized for detection and/or diagnosis of SARS-CoV-2 by FDA under an Emergency Use Authorization (EUA). This EUA will remain in effect (meaning this test can be used) for the duration of the COVID-19 declaration under Section 564(b)(1) of the Act, 21 U.S.C. section 360bbb-3(b)(1), unless the authorization is terminated or revoked.  Performed at [facility], [address] [place], [place] [address]   Blood  Culture (routine x 2)     Status: None (Preliminary result)   Collection Time: 06/25/20  5:29 AM   Specimen: BLOOD RIGHT FOREARM  Result Value Ref Range Status   Specimen Description   Final    BLOOD RIGHT FOREARM Performed at [facility], [address] [place], [place] [address]    Special Requests   Final    BOTTLES DRAWN AEROBIC AND ANAEROBIC Blood Culture adequate volume Performed at [facility], [address] [place], [place] [address]    Culture   Final    NO GROWTH 3 DAYS Performed at [facility], [place] [address], [place], [place] [address]    Report Status PENDING  Incomplete  MRSA PCR Screening     Status: None   Collection Time: 06/25/20 12:41 PM   Specimen: Nasal Mucosa; Nasopharyngeal  Result Value Ref Range Status   MRSA by PCR NEGATIVE NEGATIVE Final    Comment:        The GeneXpert MRSA Assay (FDA approved for NASAL specimens only), is one component of a comprehensive MRSA colonization surveillance program. It is not intended to diagnose MRSA infection nor to guide or monitor treatment for MRSA infections. Performed at [facility] [place] [address], [place], [place] [address]   Respiratory Panel by PCR     Status: None   Collection Time: 06/25/20 12:41 PM   Specimen: Nasopharyngeal Swab; Respiratory  Result Value Ref Range Status   Adenovirus NOT DETECTED NOT DETECTED Final   Coronavirus 229E NOT DETECTED NOT DETECTED Final    Comment: (NOTE) The Coronavirus on the Respiratory Panel, DOES NOT test for the novel  Coronavirus (2019 nCoV)    Coronavirus HKU1 NOT DETECTED NOT DETECTED Final   Coronavirus NL63 NOT DETECTED NOT DETECTED Final   Coronavirus OC43 NOT DETECTED NOT DETECTED Final   Metapneumovirus NOT DETECTED NOT DETECTED Final   Rhinovirus / Enterovirus NOT DETECTED NOT DETECTED Final   Influenza A NOT DETECTED NOT DETECTED Final   Influenza B NOT DETECTED NOT DETECTED Final   Parainfluenza  Virus 1 NOT DETECTED NOT DETECTED Final   Parainfluenza Virus 2 NOT DETECTED NOT DETECTED Final   Parainfluenza Virus 3 NOT DETECTED NOT DETECTED Final   Parainfluenza Virus 4 NOT DETECTED NOT DETECTED Final   Respiratory Syncytial Virus NOT DETECTED NOT DETECTED Final   Bordetella pertussis NOT DETECTED NOT DETECTED Final   Bordetella Parapertussis NOT DETECTED NOT DETECTED Final   Chlamydophila pneumoniae NOT DETECTED NOT DETECTED Final   Mycoplasma pneumoniae NOT DETECTED NOT DETECTED Final    Comment: Performed at [facility], [place]. [address] [place], [place] [address]         Radiology Studies: MR WRIST RIGHT WO CONTRAST  Result Date: 06/27/2020 CLINICAL DATA:  Wrist pain EXAM: MR OF THE RIGHT WRIST WITHOUT CONTRAST TECHNIQUE: Multiplanar, multisequence MR imaging of the right wrist was performed. No intravenous contrast was administered. COMPARISON:  None. FINDINGS: The study is somewhat limited due to technique. Alignment Ulnar variance:  None Distal radioulnar joint Congruent. Fluid A small distal radioulnar joint effusion is seen. Intrinsic ligaments Scapholunate ligament:  Intact. Lunotriquetral ligament:  Intact. Triangular fibrocartilage: I the TFCC appears to be attenuated, however grossly intact. Other: Normal Extensor compartment A small amount of fluid is seen surrounding extensor compartment 1 and 2 at the level of the proximal carpal row. There is also a small amount of fluid seen surrounding extensor carpi ulnaris tendon. Flexor compartment Carpal tunnel:  Normal. Median nerve:  Normal. Flexor retinaculum:  Normal. Flexor tendons:  Normal in position, signal,and configuration. Guyon canal:  Normal. Articulations Thumb carpometacarpal joint:  Normal. Pisiform-triquetral joint:  Normal. Lunate facets: Type 2 Other findings Bones: The patient has had a prior trapezii ectomy. Small cystic changes are seen throughout the carpals. However no bony erosive type change is seen.  No areas of periosteal reaction or cortical destruction. Muscles:  Normal. Vessels:  Normal. Soft Tissues: Dorsal subcutaneous edema is seen throughout the wrist. No focal soft tissue mass or fluid collection. IMPRESSION: Small cystic degenerative changes are seen throughout the carpals. The patient is status post trapeziectomy. No acute osseous abnormality. Mild extensor compartment 1, 2, and extensor carpi ulnaris tenosynovitis Small radioulnar joint effusion Degenerative changes of the TFCC, however does appear to be intact. Electronically Signed   By: Prudencio Pair M.D.   On: 06/27/2020 22:59        Scheduled Meds: . colchicine  0.6 mg Oral BID  . fluconazole  100 mg Oral Daily  . mouth rinse  15 mL Mouth Rinse BID  . nystatin  5 mL Oral Q6H  . pantoprazole  40 mg Oral BID  . predniSONE  10 mg  Oral BID WC  . sertraline  100 mg Oral Daily  . sucralfate  1 g Oral TID WC & HS   Continuous Infusions: . ceFEPime (MAXIPIME) IV 2 g (06/28/20 1313)     LOS: 2 days       Hosie Poisson, MD [facility]   To contact the attending provider between 7A-7P or the covering provider during after hours 7P-7A, please log into the web site [email or web address] and access using universal [place] password for that web site. If you do not have the password, please call the hospital operator.  06/28/2020, 3:58 PM

## 2020-06-29 DIAGNOSIS — K50919 Crohn's disease, unspecified, with unspecified complications: Secondary | ICD-10-CM | POA: Diagnosis not present

## 2020-06-29 DIAGNOSIS — J029 Acute pharyngitis, unspecified: Secondary | ICD-10-CM

## 2020-06-29 DIAGNOSIS — B370 Candidal stomatitis: Secondary | ICD-10-CM

## 2020-06-29 DIAGNOSIS — M2550 Pain in unspecified joint: Secondary | ICD-10-CM | POA: Diagnosis not present

## 2020-06-29 DIAGNOSIS — K5090 Crohn's disease, unspecified, without complications: Secondary | ICD-10-CM | POA: Diagnosis present

## 2020-06-29 DIAGNOSIS — R1310 Dysphagia, unspecified: Secondary | ICD-10-CM | POA: Diagnosis not present

## 2020-06-29 DIAGNOSIS — M11239 Other chondrocalcinosis, unspecified wrist: Secondary | ICD-10-CM

## 2020-06-29 DIAGNOSIS — M25531 Pain in right wrist: Secondary | ICD-10-CM

## 2020-06-29 DIAGNOSIS — R509 Fever, unspecified: Secondary | ICD-10-CM

## 2020-06-29 DIAGNOSIS — K219 Gastro-esophageal reflux disease without esophagitis: Secondary | ICD-10-CM

## 2020-06-29 DIAGNOSIS — E876 Hypokalemia: Secondary | ICD-10-CM | POA: Diagnosis present

## 2020-06-29 DIAGNOSIS — I10 Essential (primary) hypertension: Secondary | ICD-10-CM | POA: Diagnosis present

## 2020-06-29 LAB — BASIC METABOLIC PANEL
Anion gap: 7 (ref 5–15)
BUN: 16 mg/dL (ref 8–23)
CO2: 23 mmol/L (ref 22–32)
Calcium: 8.3 mg/dL — ABNORMAL LOW (ref 8.9–10.3)
Chloride: 111 mmol/L (ref 98–111)
Creatinine, Ser: 0.68 mg/dL (ref 0.44–1.00)
GFR, Estimated: >60 mL/min (ref >60)
Glucose, Bld: 153 mg/dL — ABNORMAL HIGH (ref 70–99)
Potassium: 3.5 mmol/L (ref 3.5–5.1)
Sodium: 141 mmol/L (ref 135–145)

## 2020-06-29 LAB — C-REACTIVE PROTEIN: CRP: 19 mg/dL — ABNORMAL HIGH (ref <1.0)

## 2020-06-29 LAB — CBC
HCT: 36 % (ref 36.0–46.0)
Hemoglobin: 11.4 g/dL — ABNORMAL LOW (ref 12.0–15.0)
MCH: 32.2 pg (ref 26.0–34.0)
MCHC: 31.7 g/dL (ref 30.0–36.0)
MCV: 101.7 fL — ABNORMAL HIGH (ref 80.0–100.0)
Platelets: 113 10*3/uL — ABNORMAL LOW (ref 150–400)
RBC: 3.54 MIL/uL — ABNORMAL LOW (ref 3.87–5.11)
RDW: 14.2 % (ref 11.5–15.5)
WBC: 4 10*3/uL (ref 4.0–10.5)
nRBC: 0 % (ref 0.0–0.2)

## 2020-06-29 MED ORDER — FLUCONAZOLE 100 MG PO TABS
100.0000 mg | ORAL_TABLET | Freq: Every day | ORAL | Status: DC
  Administered 2020-06-30: 11:00:00 100 mg via ORAL
  Filled 2020-06-29: qty 1

## 2020-06-29 MED ORDER — PREDNISONE 5 MG PO TABS
10.0000 mg | ORAL_TABLET | Freq: Two times a day (BID) | ORAL | Status: DC
  Administered 2020-06-30: 08:00:00 10 mg via ORAL
  Filled 2020-06-29: qty 2

## 2020-06-29 MED ORDER — POTASSIUM CHLORIDE CRYS ER 20 MEQ PO TBCR
40.0000 meq | EXTENDED_RELEASE_TABLET | Freq: Once | ORAL | Status: AC
  Administered 2020-06-29: 10:00:00 40 meq via ORAL
  Filled 2020-06-29: qty 2

## 2020-06-29 MED ORDER — PREDNISONE 5 MG PO TABS: 10.0000 mg | ORAL_TABLET | Freq: Two times a day (BID) | ORAL | Status: DC

## 2020-06-29 NOTE — Progress Notes (Signed)
PROGRESS NOTE    [name]  ZOX:[id number] DOB: 1946-04-09 DOA: 06/25/2020 PCP: Patient, No Pcp Per    Chief Complaint  Patient presents with  . Sore Throat    Brief Narrative:  Sheila Davenport a 74 y.o.femalewith medical history significant ofCrohn's disease on prednisone and infliximab, on chronic immunosuppression,  GERD, HLD, HTN   Presents with bilateral wrist pain, bilateral shoulder pain and sore throat, dysphagia, generalized weakness, nausea, fever of 100.6. Blood cultures drawn and pending. Respiratory panel is negative. CXR, UA is negative for infection.  Orthopedics consulted and following.   Assessment & Plan:   Principal Problem:   Polyarthralgia Active Problems:   Fever in adult   Pseudogout of wrist   Oral candidiasis   Dysphagia   GERD (gastroesophageal reflux disease)   Hypokalemia   HTN (hypertension)   Crohn disease (HCC)  1 polyarthralgia/bilateral wrist pseudogout/right wrist pain Questionable etiology.  Patient seen in consultation by orthopedics who does not feel this is an infectious arthropathy of the right wrist.  Concerned that patient may have rebound arthralgias due to 85-month history of daily prednisone.  Patient however does state last dose of prednisone was the morning of admission.  CRP noted to be elevated.  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  Right wrist was swollen and tender but has improved.  MRI of the wrist negative for any acute fracture however show small cystic degenerative changes seen throughout the carpals, mild extensor compartment and extensor carpi ulnar wrist T no synovitis, small radial ulnar joint effusion.  Continue prednisone 10 mg twice daily until outpatient follow-up with a slow taper, continue colchicine, pain control.  PT OT assess patient initially recommended SNF however patient refused SNF at that time and wanted to go home with home health.   Home health set up however patient and family have changed their minds and would like to consider SNF.  Social work/TOC consulted for SNF placement.  Will need outpatient follow-up with orthopedics.  2.  Right wrist swelling Initial concern was for septic arthritis secondary to immunocompromise state.  MRI wrist which was done was negative for any fracture.  Orthopedics consulted and do not feel any surgical intervention required at this time.  Patient with clinical improvement on prednisone and colchicine which we will continue.  Outpatient follow-up with orthopedics.  3.  Fever of unclear etiology Patient pancultured with no growth to date.  [facility] spotted fever IgM negative.  Procalcitonin negative.  Doxycycline discontinued.  Will DC IV cefepime after today's doses are given.  Follow.  Supportive care.  4.  Rash on the palms/polyarthralgia/fever Fever however [facility] spotted fever labs negative.  Patient initially placed on doxycycline which has been discontinued.  5.  Oral thrush/oral candidiasis Patient with some complaints of dysphagia on admission, noted to have a painful oral cavity and thrush noted on tongue.  Patient chronically immunosuppressed.  Patient placed on nystatin and oral Diflucan, Carafate with clinical improvement.  Supportive care.  6.  GERD PPI.  7.  Hypokalemia Repleted  8.  Hypertension Controlled.  9.  Crohn's disease Patient noted to be coming off a prednisone taper after a flareup.  Infliximab on hold.  Has been concerned that patient symptoms may have been a side effect of infliximab.  Continue prednisone 10 mg twice daily for another 7 days and then 10 mg prednisone daily until follow-up with gastroenterologist.   DVT prophylaxis: SCDs Code Status: Full Family Communication: Updated  patient and husband at bedside. Disposition:   Status is: Observation    Dispo: The patient is from: Home              Anticipated d/c is to: SNF               Anticipated d/c date is: Hopefully tomorrow if SNF bed available.              Patient currently with polyarthralgias, bilateral wrist pseudogout, slowly improving, has been assessed by PT and unsafe to be discharged home and likely will need skilled nursing facility placement.       Consultants:   Orthopedics: Dr. Aundria Rud 06/26/2020  Procedures:  Plain films of bilateral wrist 06/25/2020  Plain films of the right shoulder 06/25/2020  Chest x-ray 06/25/2020  MRI right wrist 06/27/2020  Antimicrobials:   IV cefepime 06/25/2020>>>> 06/29/2020  IV doxycycline 06/25/2020>>>> 06/28/2020  IV vancomycin 12/25/ 2021x1 dose.  Diflucan 06/26/2020>>>>   Subjective: Patient laying in bed.  States dysphagia/difficulty swallowing has improved after being started on nystatin and Diflucan.  Tolerating current diet.  States joint pain improved.  Feels better than she did on admission.  Husband at bedside concerns about patient's debility and how he is going to be able to transport up the stairs if they were to go home although they had refused SNF.  Husband concerned some of patient's symptoms may be an allergic reaction to infliximab.  Objective: Vitals:   06/28/20 0513 06/28/20 1430 06/28/20 2135 06/29/20 0456  BP: 130/67 125/68 (!) 165/69   Pulse: 77 79 75 90  Resp: 20 (!) 24 18 16   Temp: 98.9 F (37.2 C) 99.3 F (37.4 C) 98.8 F (37.1 C) 98.2 F (36.8 C)  TempSrc: Oral Oral Oral Oral  SpO2: 95% 91% 94% 97%  Weight:      Height:        Intake/Output Summary (Last 24 hours) at 06/29/2020 1813 Last data filed at 06/29/2020 1036 Gross per 24 hour  Intake 500 ml  Output 400 ml  Net 100 ml   Filed Weights   06/25/20 0441  Weight: 77.1 kg    Examination:  General exam: Appears calm and comfortable  Respiratory system: Clear to auscultation. Respiratory effort normal. Cardiovascular system: S1 & S2 heard, RRR. No JVD, murmurs, rubs, gallops or clicks. No pedal  edema. Gastrointestinal system: Abdomen is nondistended, soft and nontender. No organomegaly or masses felt. Normal bowel sounds heard. Central nervous system: Alert and oriented. No focal neurological deficits. Extremities: Symmetric 5 x 5 power. Skin: No rashes, lesions or ulcers Psychiatry: Judgement and insight appear normal. Mood & affect appropriate.     Data Reviewed: I have personally reviewed following labs and imaging studies  CBC: Recent Labs  Lab 06/25/20 0510 06/26/20 0516 06/29/20 0247  WBC 9.3 7.1 4.0  NEUTROABS 6.7  --   --   HGB 14.3 13.9 11.4*  HCT 41.9 42.3 36.0  MCV 96.8 99.5 101.7*  PLT 118* PLATELET CLUMPS NOTED ON SMEAR, UNABLE TO ESTIMATE 113*    Basic Metabolic Panel: Recent Labs  Lab 06/25/20 0510 06/25/20 1208 06/26/20 0516 06/26/20 0957 06/27/20 0433 06/29/20 0247  NA 140  --  138  --  138 141  K 3.0*  --  3.0*  --  3.7 3.5  CL 103  --  102  --  107 111  CO2 27  --  25  --  23 23  GLUCOSE 92  --  108*  --  156* 153*  BUN 17  --  10  --  18 16  CREATININE 0.82  --  0.73  --  0.80 0.68  CALCIUM 9.3  --  8.5*  --  8.2* 8.3*  MG  --  2.0  --  1.7  --   --     GFR: Estimated Creatinine Clearance: 59.8 mL/min (by C-G formula based on SCr of 0.68 mg/dL).  Liver Function Tests: Recent Labs  Lab 06/25/20 0510 06/26/20 0516  AST 20 20  ALT 32 28  ALKPHOS 47 44  BILITOT 0.7 1.6*  PROT 5.9* 5.7*  ALBUMIN 3.5 3.1*    CBG: No results for input(s): GLUCAP in the last 168 hours.   Recent Results (from the past 240 hour(s))  Urine culture     Status: Abnormal   Collection Time: 06/25/20  5:00 AM   Specimen: In/Out Cath Urine  Result Value Ref Range Status   Specimen Description   Final    IN/OUT CATH URINE Performed at [facility], [address] [place], [place] [address]    Special Requests   Final    NONE Performed at [facility], [address] [place], [place] [address]    Culture MULTIPLE SPECIES  PRESENT, SUGGEST RECOLLECTION (A)  Final   Report Status 06/26/2020 FINAL  Final  Group A Strep by PCR     Status: None   Collection Time: 06/25/20  5:06 AM   Specimen: Throat; Sterile Swab  Result Value Ref Range Status   Group A Strep by PCR NOT DETECTED NOT DETECTED Final    Comment: Performed at [facility], [address] [place], [place] [address]  Blood Culture (routine x 2)     Status: None (Preliminary result)   Collection Time: 06/25/20  5:10 AM   Specimen: BLOOD LEFT WRIST  Result Value Ref Range Status   Specimen Description   Final    BLOOD LEFT WRIST Performed at [facility], [address] [place], [place] [address]    Special Requests   Final    BOTTLES DRAWN AEROBIC AND ANAEROBIC Blood Culture adequate volume Performed at [facility], [address] [place], [place] [address]    Culture   Final    NO GROWTH 4 DAYS Performed at [facility], [address], [place], [place] [address]    Report Status PENDING  Incomplete  Resp Panel by RT-PCR (Flu A&B, Covid) Nasopharyngeal Swab     Status: None   Collection Time: 06/25/20  5:12 AM   Specimen: Nasopharyngeal Swab; Nasopharyngeal(NP) swabs in vial transport medium  Result Value Ref Range Status   SARS Coronavirus 2 by RT PCR NEGATIVE NEGATIVE Final    Comment: (NOTE) SARS-CoV-2 target nucleic acids are NOT DETECTED.  The SARS-CoV-2 RNA is generally detectable in upper respiratory specimens during the acute phase of infection. The lowest concentration of SARS-CoV-2 viral copies this assay can detect is 138 copies/mL. A negative result does not preclude SARS-Cov-2 infection and should not be used as the sole basis for treatment or other patient management decisions. A negative result may occur with  improper specimen collection/handling, submission of specimen other than nasopharyngeal swab, presence of viral mutation(s) within the areas targeted by this assay, and  inadequate number of viral copies(<138 copies/mL). A negative result must be combined with clinical observations, patient history, and epidemiological information. The expected result is Negative.  Fact Sheet for Patients:  BloggerCourse.com  Fact Sheet for Healthcare Providers:  SeriousBroker.it  This test is no t yet approved or cleared by the [place] FDA and  has been authorized for detection and/or diagnosis of SARS-CoV-2 by FDA under an Emergency Use Authorization (EUA). This EUA will remain  in effect (meaning this test can be used) for the duration of the COVID-19 declaration under Section 564(b)(1) of the Act, 21 U.S.C.section 360bbb-3(b)(1), unless the authorization is terminated  or revoked sooner.       Influenza A by PCR NEGATIVE NEGATIVE Final   Influenza B by PCR NEGATIVE NEGATIVE Final    Comment: (NOTE) The Xpert Xpress SARS-CoV-2/FLU/RSV plus assay is intended as an aid in the diagnosis of influenza from Nasopharyngeal swab specimens and should not be used as a sole basis for treatment. Nasal washings and aspirates are unacceptable for Xpert Xpress SARS-CoV-2/FLU/RSV testing.  Fact Sheet for Patients: BloggerCourse.com  Fact Sheet for Healthcare Providers: SeriousBroker.it  This test is not yet approved or cleared by the [place] FDA and has been authorized for detection and/or diagnosis of SARS-CoV-2 by FDA under an Emergency Use Authorization (EUA). This EUA will remain in effect (meaning this test can be used) for the duration of the COVID-19 declaration under Section 564(b)(1) of the Act, 21 U.S.C. section 360bbb-3(b)(1), unless the authorization is terminated or revoked.  Performed at [facility], [address] [place], [place] [address]   Blood Culture (routine x 2)     Status: None (Preliminary result)   Collection Time:  06/25/20  5:29 AM   Specimen: BLOOD RIGHT FOREARM  Result Value Ref Range Status   Specimen Description   Final    BLOOD RIGHT FOREARM Performed at [facility], [address] [place], [place] [address]    Special Requests   Final    BOTTLES DRAWN AEROBIC AND ANAEROBIC Blood Culture adequate volume Performed at [facility], [address] [place], [place] [address]    Culture   Final    NO GROWTH 4 DAYS Performed at [facility], [address], [place], [place] [address]    Report Status PENDING  Incomplete  MRSA PCR Screening     Status: None   Collection Time: 06/25/20 12:41 PM   Specimen: Nasal Mucosa; Nasopharyngeal  Result Value Ref Range Status   MRSA by PCR NEGATIVE NEGATIVE Final    Comment:        The GeneXpert MRSA Assay (FDA approved for NASAL specimens only), is one component of a comprehensive MRSA colonization surveillance program. It is not intended to diagnose MRSA infection nor to guide or monitor treatment for MRSA infections. Performed at [facility], [address] [place], [place] [address]   Respiratory Panel by PCR     Status: None   Collection Time: 06/25/20 12:41 PM   Specimen: Nasopharyngeal Swab; Respiratory  Result Value Ref Range Status   Adenovirus NOT DETECTED NOT DETECTED Final   Coronavirus 229E NOT DETECTED NOT DETECTED Final    Comment: (NOTE) The Coronavirus on the Respiratory Panel, DOES NOT test for the novel  Coronavirus (2019 nCoV)    Coronavirus HKU1 NOT DETECTED NOT DETECTED Final   Coronavirus NL63 NOT DETECTED NOT DETECTED Final   Coronavirus OC43 NOT DETECTED NOT DETECTED Final   Metapneumovirus NOT DETECTED NOT DETECTED Final   Rhinovirus / Enterovirus NOT DETECTED NOT DETECTED Final   Influenza A  NOT DETECTED NOT DETECTED Final   Influenza B NOT DETECTED NOT DETECTED Final   Parainfluenza Virus 1 NOT DETECTED NOT DETECTED Final   Parainfluenza Virus 2 NOT DETECTED  NOT DETECTED Final   Parainfluenza Virus 3 NOT DETECTED NOT DETECTED Final   Parainfluenza Virus 4 NOT DETECTED NOT DETECTED Final   Respiratory Syncytial Virus NOT DETECTED NOT DETECTED Final   Bordetella pertussis NOT DETECTED NOT DETECTED Final   Bordetella Parapertussis NOT DETECTED NOT DETECTED Final   Chlamydophila pneumoniae NOT DETECTED NOT DETECTED Final   Mycoplasma pneumoniae NOT DETECTED NOT DETECTED Final    Comment: Performed at [facility], [address], [place], [place] [address]         Radiology Studies: MR WRIST RIGHT WO CONTRAST  Result Date: 06/27/2020 CLINICAL DATA:  Wrist pain EXAM: MR OF THE RIGHT WRIST WITHOUT CONTRAST TECHNIQUE: Multiplanar, multisequence MR imaging of the right wrist was performed. No intravenous contrast was administered. COMPARISON:  None. FINDINGS: The study is somewhat limited due to technique. Alignment Ulnar variance:  None Distal radioulnar joint Congruent. Fluid A small distal radioulnar joint effusion is seen. Intrinsic ligaments Scapholunate ligament:  Intact. Lunotriquetral ligament:  Intact. Triangular fibrocartilage: I the TFCC appears to be attenuated, however grossly intact. Other: Normal Extensor compartment A small amount of fluid is seen surrounding extensor compartment 1 and 2 at the level of the proximal carpal row. There is also a small amount of fluid seen surrounding extensor carpi ulnaris tendon. Flexor compartment Carpal tunnel:  Normal. Median nerve:  Normal. Flexor retinaculum:  Normal. Flexor tendons:  Normal in position, signal,and configuration. Guyon canal:  Normal. Articulations Thumb carpometacarpal joint:  Normal. Pisiform-triquetral joint:  Normal. Lunate facets: Type 2 Other findings Bones: The patient has had a prior trapezii ectomy. Small cystic changes are seen throughout the carpals. However no bony erosive type change is seen. No areas of periosteal reaction or cortical destruction. Muscles:  Normal.  Vessels:  Normal. Soft Tissues: Dorsal subcutaneous edema is seen throughout the wrist. No focal soft tissue mass or fluid collection. IMPRESSION: Small cystic degenerative changes are seen throughout the carpals. The patient is status post trapeziectomy. No acute osseous abnormality. Mild extensor compartment 1, 2, and extensor carpi ulnaris tenosynovitis Small radioulnar joint effusion Degenerative changes of the TFCC, however does appear to be intact. Electronically Signed   By: Jonna Clark M.D.   On: 06/27/2020 22:59        Scheduled Meds: . colchicine  0.6 mg Oral BID  . fluconazole  100 mg Oral Daily  . mouth rinse  15 mL Mouth Rinse BID  . nystatin  5 mL Oral Q6H  . pantoprazole  40 mg Oral BID  . predniSONE  10 mg Oral BID WC  . [START ON 06/30/2020] predniSONE  10 mg Oral BID WC  . sertraline  100 mg Oral Daily  . sucralfate  1 g Oral TID WC & HS   Continuous Infusions: . ceFEPime (MAXIPIME) IV 2 g (06/29/20 1455)     LOS: 2 days    Time spent: 45 minutes    Ramiro Harvest, MD [facility]   To contact the attending provider between 7A-7P or the covering provider during after hours 7P-7A, please log into the web site [email or web address] and access using universal [place] password for that web site. If you do not have the password, please call the hospital operator.  06/29/2020, 6:13 PM

## 2020-06-29 NOTE — NC FL2 (Signed)
[place] MEDICAID FL2 LEVEL OF CARE SCREENING TOOL     IDENTIFICATION  Patient Name: Sheila Davenport Birthdate: [birth year] Sex: female Admission Date (Current Location): 06/25/2020  [facility] and [place] Number:  Producer, television/film/video and Address:  [facility],  [address], [place] [address]      Provider Number: [id number]  Attending Physician Name and Address:  Rodolph Bong, MD  Relative Name and Phone Number:  spouse, Fayrene Fearing @ [phone]    Current Level of Care: Hospital Recommended Level of Care: Skilled Nursing Facility Prior Approval Number:    Date Approved/Denied:   PASRR Number: [id number]  Discharge Plan: SNF    Current Diagnoses: Patient Active Problem List   Diagnosis Date Noted  . Polyarthralgia 06/26/2020  . Fever in adult 06/25/2020  . SIRS (systemic inflammatory response syndrome) (HCC) 08/20/2016  . Diverticulitis of intestine without bleeding 08/20/2016  . Acute diverticulitis 08/20/2016  . Fever 08/19/2016    Orientation RESPIRATION BLADDER Height & Weight     Self,Time,Situation,Place  Normal Continent Weight: 170 lb (77.1 kg) Height:  5\' 3"  (160 cm)  BEHAVIORAL SYMPTOMS/MOOD NEUROLOGICAL BOWEL NUTRITION STATUS      Continent    AMBULATORY STATUS COMMUNICATION OF NEEDS Skin   Limited Assist Verbally Normal                       Personal Care Assistance Level of Assistance  Bathing,Dressing Bathing Assistance: Limited assistance   Dressing Assistance: Limited assistance     Functional Limitations Info             SPECIAL CARE FACTORS FREQUENCY  PT (By licensed PT),OT (By licensed OT)     PT Frequency: 5x/wk OT Frequency: 5x/wk            Contractures Contractures Info: Not present    Additional Factors Info  Code Status,Psychotropic,Allergies Code Status Info: Full Allergies Info: see MAR Psychotropic Info: see MAR         Current Medications (06/29/2020):  This is the current  hospital active medication list Current Facility-Administered Medications  Medication Dose Route Frequency Provider Last Rate Last Admin  . acetaminophen (TYLENOL) tablet 650 mg  650 mg Oral Q6H PRN 07/01/2020, Tyrone A, DO   650 mg at 06/29/20 0945   Or  . acetaminophen (TYLENOL) suppository 650 mg  650 mg Rectal Q6H PRN 07/01/20, Tyrone A, DO      . ceFEPIme (MAXIPIME) 2 g in sodium chloride 0.9 % 100 mL IVPB  2 g Intravenous Q8H Kyle, Tyrone A, DO   Stopped at 06/29/20 [phone]  . colchicine tablet 0.6 mg  0.6 mg Oral BID 9024, MD   0.6 mg at 06/29/20 0944  . fentaNYL (SUBLIMAZE) injection 12.5 mcg  12.5 mcg Intravenous Q4H PRN 07/01/20, Tyrone A, DO   12.5 mcg at 06/25/20 1214  . fluconazole (DIFLUCAN) tablet 100 mg  100 mg Oral Daily 06/27/20, MD   100 mg at 06/29/20 0944  . ipratropium (ATROVENT) nebulizer solution 0.5 mg  0.5 mg Nebulization Q6H PRN 07/01/20, Tyrone A, DO      . MEDLINE mouth rinse  15 mL Mouth Rinse BID Ronaldo Miyamoto, MD   15 mL at 06/28/20 2208  . nystatin (MYCOSTATIN) 100000 UNIT/ML suspension 500,000 Units  5 mL Oral Q6H Kyle, Tyrone A, DO   500,000 Units at 06/29/20 1239  . ondansetron (ZOFRAN) tablet 4 mg  4 mg Oral Q6H PRN 07/01/20, Tyrone A, DO  Or  . ondansetron (ZOFRAN) injection 4 mg  4 mg Intravenous Q6H PRN Ronaldo Miyamoto, Tyrone A, DO   4 mg at 06/27/20 1521  . oxyCODONE (Oxy IR/ROXICODONE) immediate release tablet 5 mg  5 mg Oral Q4H PRN Ronaldo Miyamoto, Tyrone A, DO   5 mg at 06/26/20 2137  . pantoprazole (PROTONIX) EC tablet 40 mg  40 mg Oral BID Kathlen Mody, MD   40 mg at 06/29/20 0944  . phenol (CHLORASEPTIC) mouth spray 1 spray  1 spray Mouth/Throat PRN Ronaldo Miyamoto, Tyrone A, DO   1 spray at 06/26/20 0924  . predniSONE (DELTASONE) tablet 10 mg  10 mg Oral BID WC Kathlen Mody, MD   10 mg at 06/29/20 0945  . sertraline (ZOLOFT) tablet 100 mg  100 mg Oral Daily Kathlen Mody, MD   100 mg at 06/29/20 0945  . sucralfate (CARAFATE) 1 GM/10ML suspension 1 g  1 g Oral TID WC & HS Kyle,  Tyrone A, DO   1 g at 06/29/20 1239     Discharge Medications: Please see discharge summary for a list of discharge medications.  Relevant Imaging Results:  Relevant Lab Results:   Additional Information SS# [id number]  Amada Jupiter, LCSW

## 2020-06-29 NOTE — Progress Notes (Addendum)
Physical Therapy Treatment Patient Details Name: Sheila Davenport MRN: [id number] DOB: [birth year] Today's Date: 06/29/2020    History of Present Illness 74 yo female admitted with fever, polyarthralgia with specific R shoulder pain and dysfunction. Hx of Crohns disease, osteoporosis, R TKA, SIRS/sepsis, hypotension    PT Comments    Pt in bed with spouse at bedside.  First assisted OOB to amb to bathroom.  General bed mobility comments: pt self able but HOB was elevated and she used bed rail (sleeps in a KING at home) General transfer comment: pt was unable to self rise from regular height bed and toilet despite several attemps.  Pt required Mod Assist to rise and Min Assist to steady. General Gait Details: First amb to bathroom pt required increased time and asisst to steady due to c/o B LE weakness and "Bad knees" as well as joint stiffness/pain which is improving but still debilitating.  Unstaedy esp with turns and back step to toilet/recliner.  Limited distance as well compated to her pre hospital stay.  Pt has 7 or 8 steps to enter her home.  Spouse stated he is physically unable ti asisst her.  They have a daughter but "she works". Pt and spouse waiving back and forth about final D/C plans    Follow Up Recommendations  PT rec SNF (spouse admits he is physically unable to assist pt)  if they decide HOME then will need PTAR transport   Equipment Recommendations  None recommended by PT    Recommendations for Other Services       Precautions / Restrictions Precautions Precautions: Fall Precaution Comments: "bad knees"    Mobility  Bed Mobility Overal bed mobility: Needs Assistance Bed Mobility: Supine to Sit     Supine to sit: Supervision;Min guard     General bed mobility comments: pt self able but HOB was elevated and she used bed rail (sleeps in a KING at home)  Transfers Overall transfer level: Needs assistance Equipment used: Rolling walker (2 wheeled) Transfers: Sit  to/from [employer] to Stand: Mod assist Stand pivot transfers: Mod assist       General transfer comment: pt was unable to self rise from regular height bed and toilet despite several attemps.  Pt required Mod Assist to rise and Min Assist to steady.  Ambulation/Gait Ambulation/Gait assistance: Min assist Gait Distance (Feet): 22 Feet Assistive device: Rolling walker (2 wheeled) Gait Pattern/deviations: Step-to pattern;Decreased stance time - right;Decreased step length - right;Decreased step length - left Gait velocity: decreased   General Gait Details: First amb to bathroom pt required increased time and asisst to steady due to c/o B LE weakness and "Bad knees" as well as joint stiffness/pain which is improving but still debilitating.  Unstaedy esp with turns and back step to toilet/recliner.  Limited distance as well compated to her pre hospital stay.  Pt has 7 or 8 steps to enter her home.  Spouse stated he is physically unable ti asisst her.  They have a daughter but "she works".   Stairs             Wheelchair Mobility    Modified Rankin (Stroke Patients Only)       Balance                                            Cognition Arousal/Alertness: Awake/alert Behavior During Therapy:  WFL for tasks assessed/performed Overall Cognitive Status: Within Functional Limits for tasks assessed                                 General Comments: AxO x 3 very pleasant and really wants to go home to her cat and "I missed Christmas"      Exercises      General Comments        Pertinent Vitals/Pain Pain Assessment: Faces Faces Pain Scale: Hurts little more Pain Location: "some" joints Pain Descriptors / Indicators: Grimacing;Discomfort Pain Intervention(s): Monitored during session;Repositioned    Home Living                      Prior Function            PT Goals (current goals can now be found in the  care plan section) Progress towards PT goals: Progressing toward goals    Frequency    Min 3X/week      PT Plan Current plan remains appropriate    Co-evaluation              AM-PAC PT "6 Clicks" Mobility   Outcome Measure  Help needed turning from your back to your side while in a flat bed without using bedrails?: A Lot Help needed moving from lying on your back to sitting on the side of a flat bed without using bedrails?: A Lot Help needed moving to and from a bed to a chair (including a wheelchair)?: A Lot Help needed standing up from a chair using your arms (e.g., wheelchair or bedside chair)?: A Lot Help needed to walk in hospital room?: A Lot Help needed climbing 3-5 steps with a railing? : Total 6 Click Score: 11    End of Session Equipment Utilized During Treatment: Gait belt Activity Tolerance: Patient limited by pain;Patient limited by fatigue Patient left: in chair;with family/visitor present;with call bell/phone within reach Nurse Communication: Mobility status;Other (comment) (left room with pt and spouse to discuss a final D/C plan Home via PTAR vs ST Rehab at SNF) PT Visit Diagnosis: Muscle weakness (generalized) (M62.81);Difficulty in walking, not elsewhere classified (R26.2);Pain Pain - part of body: Knee     Time: 1306-1330 PT Time Calculation (min) (ACUTE ONLY): 24 min  Charges:  $Gait Training: 8-22 mins $Therapeutic Exercise: 8-22 mins                     Felecia Shelling  PTA Acute  Rehabilitation Services Pager      [phone] Office      [phone]

## 2020-06-29 NOTE — TOC Progression Note (Signed)
Transition of Care [facility]) - Progression Note    Patient Details  Name: Sheila Davenport MRN: [id number] Date of Birth: 03-18-1946  Transition of Care [facility]) CM/SW Contact  Lennart Pall, LCSW Phone Number: 06/29/2020, 2:26 PM  Clinical Narrative:   Met with pt and spouse this afternoon following PT session.  Both are agreed that pt is not at a functional level that spouse can support at home and request change in plan to SNF.  Reviewed facility preferences and will begin insurance authorization.  Will begin bed search.    Expected Discharge Plan: [place] Barriers to Discharge: Continued Medical Work up  Expected Discharge Plan and Services Expected Discharge Plan: [place]   Discharge Planning Services: CM Consult Post Acute Care Choice: [place] arrangements for the past 2 months: [place]: PT,OT [place]: [place] Date [place]: 06/28/20 Time HH Agency Contacted: 1430 Representative spoke with at [place]: [place] ([place]) Interventions    Readmission Risk Interventions No flowsheet data found.

## 2020-06-29 NOTE — Plan of Care (Signed)
Plan of care reviewed and discussed with the patient. 

## 2020-06-30 DIAGNOSIS — D84821 Immunodeficiency due to drugs: Secondary | ICD-10-CM

## 2020-06-30 DIAGNOSIS — M2550 Pain in unspecified joint: Secondary | ICD-10-CM | POA: Diagnosis not present

## 2020-06-30 DIAGNOSIS — R1310 Dysphagia, unspecified: Secondary | ICD-10-CM | POA: Diagnosis not present

## 2020-06-30 DIAGNOSIS — R531 Weakness: Secondary | ICD-10-CM

## 2020-06-30 DIAGNOSIS — R509 Fever, unspecified: Secondary | ICD-10-CM | POA: Diagnosis not present

## 2020-06-30 DIAGNOSIS — K50919 Crohn's disease, unspecified, with unspecified complications: Secondary | ICD-10-CM | POA: Diagnosis not present

## 2020-06-30 DIAGNOSIS — Z79899 Other long term (current) drug therapy: Secondary | ICD-10-CM

## 2020-06-30 LAB — CULTURE, BLOOD (ROUTINE X 2)
Culture: NO GROWTH
Culture: NO GROWTH
Special Requests: ADEQUATE
Special Requests: ADEQUATE

## 2020-06-30 LAB — RESP PANEL BY RT-PCR (FLU A&B, COVID) ARPGX2
Influenza A by PCR: NEGATIVE
Influenza B by PCR: NEGATIVE
SARS Coronavirus 2 by RT PCR: NEGATIVE

## 2020-06-30 LAB — BASIC METABOLIC PANEL
Anion gap: 7 (ref 5–15)
BUN: 14 mg/dL (ref 8–23)
CO2: 26 mmol/L (ref 22–32)
Calcium: 8.6 mg/dL — ABNORMAL LOW (ref 8.9–10.3)
Chloride: 107 mmol/L (ref 98–111)
Creatinine, Ser: 0.73 mg/dL (ref 0.44–1.00)
GFR, Estimated: >60 mL/min (ref >60)
Glucose, Bld: 150 mg/dL — ABNORMAL HIGH (ref 70–99)
Potassium: 3.5 mmol/L (ref 3.5–5.1)
Sodium: 140 mmol/L (ref 135–145)

## 2020-06-30 LAB — CBC WITH DIFFERENTIAL/PLATELET
Abs Immature Granulocytes: 0.02 10*3/uL (ref 0.00–0.07)
Basophils Absolute: 0 10*3/uL (ref 0.0–0.1)
Basophils Relative: 0 %
Eosinophils Absolute: 0 10*3/uL (ref 0.0–0.5)
Eosinophils Relative: 0 %
HCT: 36.4 % (ref 36.0–46.0)
Hemoglobin: 11.9 g/dL — ABNORMAL LOW (ref 12.0–15.0)
Immature Granulocytes: 0 %
Lymphocytes Relative: 24 %
Lymphs Abs: 1.1 10*3/uL (ref 0.7–4.0)
MCH: 32.2 pg (ref 26.0–34.0)
MCHC: 32.7 g/dL (ref 30.0–36.0)
MCV: 98.6 fL (ref 80.0–100.0)
Monocytes Absolute: 0.4 10*3/uL (ref 0.1–1.0)
Monocytes Relative: 7 %
Neutro Abs: 3.2 10*3/uL (ref 1.7–7.7)
Neutrophils Relative %: 69 %
Platelets: 115 10*3/uL — ABNORMAL LOW (ref 150–400)
RBC: 3.69 MIL/uL — ABNORMAL LOW (ref 3.87–5.11)
RDW: 14.5 % (ref 11.5–15.5)
WBC: 4.7 10*3/uL (ref 4.0–10.5)
nRBC: 0 % (ref 0.0–0.2)

## 2020-06-30 LAB — MAGNESIUM: Magnesium: 2.2 mg/dL (ref 1.7–2.4)

## 2020-06-30 MED ORDER — FLUCONAZOLE 100 MG PO TABS: 100.0000 mg | ORAL_TABLET | Freq: Every day | ORAL | 0 refills | Status: AC

## 2020-06-30 MED ORDER — IRBESARTAN 150 MG PO TABS
150.0000 mg | ORAL_TABLET | Freq: Every day | ORAL | Status: DC
  Administered 2020-06-30: 11:00:00 150 mg via ORAL
  Filled 2020-06-30: qty 1

## 2020-06-30 MED ORDER — POTASSIUM CHLORIDE CRYS ER 20 MEQ PO TBCR
20.0000 meq | EXTENDED_RELEASE_TABLET | Freq: Once | ORAL | Status: AC
  Administered 2020-06-30: 13:00:00 20 meq via ORAL
  Filled 2020-06-30: qty 1

## 2020-06-30 MED ORDER — POLYVINYL ALCOHOL 1.4 % OP SOLN
Freq: Every day | OPHTHALMIC | Status: DC | PRN
  Filled 2020-06-30: qty 15

## 2020-06-30 MED ORDER — ADULT MULTIVITAMIN W/MINERALS CH
1.0000 | ORAL_TABLET | Freq: Every day | ORAL | Status: DC
  Administered 2020-06-30: 11:00:00 1 via ORAL
  Filled 2020-06-30: qty 1

## 2020-06-30 MED ORDER — ROSUVASTATIN CALCIUM 10 MG PO TABS: 10.0000 mg | ORAL_TABLET | Freq: Every day | ORAL | Status: AC

## 2020-06-30 MED ORDER — PANTOPRAZOLE SODIUM 40 MG PO TBEC: 40.0000 mg | DELAYED_RELEASE_TABLET | Freq: Two times a day (BID) | ORAL | Status: DC

## 2020-06-30 MED ORDER — ATENOLOL 50 MG PO TABS
50.0000 mg | ORAL_TABLET | Freq: Every day | ORAL | Status: DC
Start: 2020-06-30 — End: 2020-06-30
  Administered 2020-06-30: 11:00:00 50 mg via ORAL
  Filled 2020-06-30: qty 1

## 2020-06-30 MED ORDER — NYSTATIN 100000 UNIT/ML MT SUSP: 5.0000 mL | Freq: Four times a day (QID) | OROMUCOSAL | 0 refills | Status: AC

## 2020-06-30 MED ORDER — OXYCODONE HCL 5 MG PO TABS: 5.0000 mg | ORAL_TABLET | ORAL | 0 refills | Status: DC | PRN

## 2020-06-30 MED ORDER — CALCIUM CARBONATE-VITAMIN D 500-200 MG-UNIT PO TABS
1.0000 | ORAL_TABLET | Freq: Every day | ORAL | Status: DC
  Administered 2020-06-30: 11:00:00 1 via ORAL
  Filled 2020-06-30: qty 1

## 2020-06-30 MED ORDER — FUROSEMIDE 10 MG/ML IJ SOLN
40.0000 mg | Freq: Once | INTRAMUSCULAR | Status: AC
  Administered 2020-06-30: 12:00:00 40 mg via INTRAVENOUS
  Filled 2020-06-30: qty 4

## 2020-06-30 MED ORDER — SUCRALFATE 1 GM/10ML PO SUSP
1.0000 g | Freq: Three times a day (TID) | ORAL | 1 refills | Status: DC
Start: 2020-06-30 — End: 2020-07-04

## 2020-06-30 MED ORDER — COLCHICINE 0.6 MG PO TABS: 0.6000 mg | ORAL_TABLET | Freq: Two times a day (BID) | ORAL | 1 refills | Status: AC

## 2020-06-30 MED ORDER — ASPIRIN 81 MG PO CHEW
81.0000 mg | CHEWABLE_TABLET | Freq: Every day | ORAL | Status: DC
Start: 2020-06-30 — End: 2020-06-30
  Administered 2020-06-30: 11:00:00 81 mg via ORAL
  Filled 2020-06-30: qty 1

## 2020-06-30 MED ORDER — POTASSIUM CHLORIDE CRYS ER 20 MEQ PO TBCR: 40.0000 meq | EXTENDED_RELEASE_TABLET | Freq: Once | ORAL | Status: DC

## 2020-06-30 MED ORDER — PREDNISONE 10 MG PO TABS: 10.0000 mg | ORAL_TABLET | Freq: Two times a day (BID) | ORAL | 1 refills | Status: AC

## 2020-06-30 MED ORDER — VITAMIN D 25 MCG (1000 UNIT) PO TABS
1000.0000 [IU] | ORAL_TABLET | Freq: Every day | ORAL | Status: DC
  Administered 2020-06-30: 11:00:00 1000 [IU] via ORAL
  Filled 2020-06-30: qty 1

## 2020-06-30 MED ORDER — POTASSIUM CHLORIDE CRYS ER 20 MEQ PO TBCR
40.0000 meq | EXTENDED_RELEASE_TABLET | Freq: Once | ORAL | Status: AC
  Administered 2020-06-30: 11:00:00 40 meq via ORAL
  Filled 2020-06-30: qty 2

## 2020-06-30 MED ORDER — FUROSEMIDE 20 MG PO TABS
20.0000 mg | ORAL_TABLET | Freq: Every day | ORAL | Status: DC
  Administered 2020-06-30: 11:00:00 20 mg via ORAL
  Filled 2020-06-30: qty 1

## 2020-06-30 NOTE — TOC Transition Note (Signed)
Transition of Care [facility]) - CM/SW Discharge Note   Patient Details  Name: Sheila Davenport MRN: [id number] Date of Birth: 29-Apr-1946  [facility]) CM/SW Contact:  Amada Jupiter, LCSW Phone Number: 06/30/2020, 1:48 PM   Clinical Narrative:    Pt has accepted SNF bed offer at [facility] and have received insurance authorization.  Medically cleared for dc to SNF today.  Pt and spouse aware PTAR has been called.  RN has called report.  No further TOC needs.   Final next level of care: Skilled Nursing Facility Barriers to Discharge: Barriers Resolved   Patient Goals and CMS Choice Patient states their goals for this hospitalization and ongoing recovery are:: wishes to return home with therapy instead of going to rehab facility CMS Medicare.gov Compare Post Acute Care list provided to:: Patient Choice offered to / list presented to : Patient  Discharge Placement PASRR number recieved: 06/29/20            Patient chooses bed at: [facility] Patient to be transferred to facility by: PTAR Name of family member notified: spouse, Rosanne Ashing Patient and family notified of of transfer: 06/30/20  Discharge Plan and Services   Discharge Planning Services: CM Consult Post Acute Care Choice: Home Health          DME Arranged: N/A DME Agency: NA       HH Arranged: NA HH Agency: NA Date HH Agency Contacted: 06/28/20 Time HH Agency Contacted: 1430 Representative spoke with at [facility] Agency: Kandee Keen  Social Determinants of Health (SDOH) Interventions     Readmission Risk Interventions No flowsheet data found.

## 2020-06-30 NOTE — Progress Notes (Signed)
Pt stable with no needs at time of ems transport. Packet sent with patient.

## 2020-06-30 NOTE — Progress Notes (Signed)
Occupational Therapy Treatment Patient Details Name: Sheila Davenport MRN: [id number] DOB: May 07, 1946 Today's Date: 06/30/2020    History of present illness 74 yo female admitted with fever, polyarthralgia with specific R shoulder pain and dysfunction. Hx of Crohns disease, osteoporosis, R TKA, SIRS/sepsis, hypotension   OT comments  Patient progressing towards goals, min A for functional transfers and mod A for LB ADLs due to decreased standing balance and reliance on UE support for stability. Pt to transfer to rehab today.    Follow Up Recommendations  SNF    Equipment Recommendations  3 in 1 bedside commode       Precautions / Restrictions Precautions Precautions: Fall Precaution Comments: "bad knees"       Mobility Bed Mobility               General bed mobility comments: OOB  Transfers Overall transfer level: Needs assistance Equipment used: 1 person hand held assist Transfers: Sit to/from Stand Sit to Stand: Min assist         General transfer comment: please see toilet transfer in ADL section    Balance Overall balance assessment: Needs assistance Sitting-balance support: Feet supported Sitting balance-Leahy Scale: Fair     Standing balance support: Single extremity supported Standing balance-Leahy Scale: Poor Standing balance comment: reliant on external assist                           ADL either performed or assessed with clinical judgement   ADL Overall ADL's : Needs assistance/impaired Eating/Feeding: Independent;Sitting                   Lower Body Dressing: Moderate assistance;Sitting/lateral leans;Sit to/from stand Lower Body Dressing Details (indicate cue type and reason): patient able to thread LEs into underwear and pants however needed assist with shoes and to pull up clothes in standing due to decreased balance Toilet Transfer: Minimal assistance;Ambulation;BSC Toilet Transfer Details (indicate cue type and reason):  min A to safely stand from commode, hand held assist x1 to take few steps from [facility] to recliner chair, very unsteady and reaching out for furniture Toileting- Clothing Manipulation and Hygiene: Sitting/lateral lean;Moderate assistance;Set up Toileting - Clothing Manipulation Details (indicate cue type and reason): set up for peri care mod A for clothing management     Functional mobility during ADLs: Minimal assistance General ADL Comments: patient much improved this session, more alert and interactive requiring less physical assistance however still unsteady and fall risk with functional transfers and standing ADLs               Cognition Arousal/Alertness: Awake/alert Behavior During Therapy: WFL for tasks assessed/performed Overall Cognitive Status: Within Functional Limits for tasks assessed                                                     Pertinent Vitals/ Pain       Pain Assessment: Faces Faces Pain Scale: No hurt         Frequency  Min 2X/week        Progress Toward Goals  OT Goals(current goals can now be found in the care plan section)  Progress towards OT goals: Progressing toward goals  Acute Rehab OT Goals Patient Stated Goal: to go rehab OT Goal Formulation: With patient Time For Goal  Achievement: 07/11/20 Potential to Achieve Goals: Good ADL Goals Pt Will Perform Upper Body Dressing: with supervision;sitting Pt Will Perform Lower Body Dressing: with supervision;sitting/lateral leans;sit to/from stand Pt Will Transfer to Toilet: with supervision;stand pivot transfer;bedside commode Pt Will Perform Toileting - Clothing Manipulation and hygiene: with supervision;sit to/from stand;sitting/lateral leans  Plan Discharge plan remains appropriate       AM-PAC OT "6 Clicks" Daily Activity     Outcome Measure   Help from another person eating meals?: A Little Help from another person taking care of personal grooming?: A Little Help  from another person toileting, which includes using toliet, bedpan, or urinal?: A Lot Help from another person bathing (including washing, rinsing, drying)?: A Lot Help from another person to put on and taking off regular upper body clothing?: A Little Help from another person to put on and taking off regular lower body clothing?: A Lot 6 Click Score: 15    End of Session  OT Visit Diagnosis: Other abnormalities of gait and mobility (R26.89);Unsteadiness on feet (R26.81);Muscle weakness (generalized) (M62.81)   Activity Tolerance Patient tolerated treatment well   Patient Left in chair;with call bell/phone within reach;with family/visitor present   Nurse Communication Mobility status        Time: 5409-8119 OT Time Calculation (min): 18 min  Charges: OT General Charges $OT Visit: 1 Visit OT Treatments $Self Care/Home Management : 8-22 mins  Marlyce Huge OT OT pager: [phone]   Carmelia Roller 06/30/2020, 2:39 PM

## 2020-06-30 NOTE — Discharge Summary (Addendum)
Physician Discharge Summary  Sheila Davenport NWG:[id number] DOB: 17-Mar-1946 DOA: 06/25/2020  PCP: Patient, No Pcp Per  Admit date: 06/25/2020 Discharge date: 06/30/2020  Time spent: 60 minutes  Recommendations for Outpatient Follow-up:  1. Follow-up with MD at SNF.  Patient will need a basic metabolic profile done in 1 week to follow-up on electrolytes and renal function. 2. Follow-up with primary orthopedist in 2 weeks. 3. Follow-up with Dr. Conley Rolls, gastroenterologist as previously scheduled.   Discharge Diagnoses:  Principal Problem:   Polyarthralgia Active Problems:   Fever in adult   Pseudogout of wrist   Oral candidiasis   Dysphagia   GERD (gastroesophageal reflux disease)   Hypokalemia   HTN (hypertension)   Crohn disease (HCC)   Sore throat   Wrist pain, acute, right   Generalized weakness   Immunosuppression due to drug therapy [facility])   Discharge Condition: Stable and improved  Diet recommendation: Heart healthy  Filed Weights   06/25/20 0441  Weight: 77.1 kg    History of present illness:  HPI per Dr Almon Hercules is a 74 y.o. female with medical history significant of Chron's, GERD, HLD, HTN. Presenting with bilateral wrist pain, right shoulder pain and sore throat. She reports that all her symptoms started at 7:30 last night. The pain in the wrists/shoulder are achy and constant. Her sore throat is actually described as throat make that makes it difficult to swallow pills, solids. She didn't try any medicines for her condition at home. She decided to go to bed. She woke up in the night with pain and feeling generally weak. Her family had to help her up from the bathroom to get her back to bed. They decided that they needed to come to the ED.    ED Course: Fever of 100.6 was recorded. UA was unremarkable. CXR was unremarkable -- although she had a new O2 requirement. She was COVID negative. She was started on vanc and cefepime. TRH was called for admission.    Hospital Course:  1 polyarthralgia/bilateral wrist pseudogout/right wrist pain Questionable etiology.  Patient seen in consultation by orthopedics who does not feel this is an infectious arthropathy of the right wrist.  Concerned that patient may have rebound arthralgias due to 64-month history of daily prednisone.  Patient however did state last dose of prednisone was the morning of admission.  CRP noted to be elevated.  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  Right wrist was swollen and tender but improved.  MRI of the wrist negative for any acute fracture however show small cystic degenerative changes seen throughout the carpals, mild extensor compartment and extensor carpi ulnar wrist T no synovitis, small radial ulnar joint effusion.  Patient maintained on prednisone 10 mg twice daily and be discharged on prednisone 10 mg twice daily x1 week and then prednisone 10 mg daily until follow-up with GI and orthopod.  Patient also maintained on colchicine twice daily.    PT/ OT assessed patient initially recommended SNF however patient refused SNF at that time and wanted to go home with home health.  Home health set up however patient and family changed their minds and would like to consider SNF.    Patient be discharged to skilled nursing facility.  Outpatient follow-up with orthopedics.   2.  Right wrist swelling Initial concern was for septic arthritis secondary to immunocompromised state.  MRI wrist which was done was negative for any fracture.  Orthopedics consulted and do not  feel any surgical intervention required at this time.  Patient placed back on prednisone 10 mg twice daily with clinical improvement as well as on colchicine.  Patient was discharged on colchicine as well as oral prednisone.  Outpatient follow-up with her orthopod.   3.  Fever of unclear etiology Patient pancultured with no growth to date.  [facility]  spotted fever IgM negative.  Procalcitonin negative.  Doxycycline discontinued.    Blood cultures were negative to date.  Patient received 5 days of IV cefepime.  No further antibiotics needed on discharge.  Patient remained afebrile during the hospitalization.  Outpatient follow-up.   4.  Rash on the palms/polyarthralgia/fever Fever however [facility] spotted fever labs negative.  Patient initially placed on doxycycline which has subsequently discontinued.  5.  Oral thrush/oral candidiasis Patient with some complaints of dysphagia on admission, noted to have a painful oral cavity and thrush noted on tongue.  Patient chronically immunosuppressed.  Patient placed on nystatin and oral Diflucan, Carafate with clinical improvement.    Patient be discharged on 2 more days of oral Diflucan and oral nystatin and 3 more days of Carafate.  Outpatient follow-up.    6.  GERD Patient maintained on PPI.  7.  Hypokalemia Repleted  8.  Hypertension Antihypertensive medications were initially held on admission and subsequently resumed during the hospitalization.  Outpatient follow-up with PCP or MD at SNF.  9.  Crohn's disease Patient noted to be coming off a prednisone taper after a flareup.  Infliximab on hold.  Has been concerned that patient symptoms may have been a side effect of infliximab.    Patient maintained on prednisone 10 mg twice daily during the hospitalization and will be continued for 1 more week and then prednisone 10 mg daily until seen by gastroenterologist.  Outpatient follow-up with GI.   Procedures:  Plain films of bilateral wrist 06/25/2020  Plain films of the right shoulder 06/25/2020  Chest x-ray 06/25/2020  MRI right wrist 06/27/2020    Consultations:  Orthopedics: Dr. Aundria Rud 06/26/2020    Discharge Exam: Vitals:   06/30/20 0551 06/30/20 1447  BP: (!) 169/75 132/73  Pulse: 67 60  Resp: 17 18  Temp: 98.2 F (36.8 C) 98.2 F (36.8 C)  SpO2: 95% 96%     General: NAD Cardiovascular: RRR Respiratory: Minimal bibasilar crackles.  Discharge Instructions   Discharge Instructions    Diet - low sodium heart healthy   Complete by: As directed    Increase activity slowly   Complete by: As directed      Allergies as of 06/30/2020      Reactions   Azathioprine    Other reaction(s): Fever (intolerance)   Levofloxacin Other (See Comments)   Unknown   Nitrofuran Derivatives Other (See Comments)   Unknown   Omeprazole Diarrhea   Phenazopyridine Other (See Comments)   Unknown   Decongest-aid [pseudoephedrine] Palpitations   Loratadine Palpitations   Allergy to anithistamines   Other Palpitations, Other (See Comments)   Antihistamine; Reactions: Tachycardia Decongestant; Reactions: Tachycardia      Medication List    STOP taking these medications   valsartan 160 MG tablet Commonly known as: DIOVAN     TAKE these medications   aspirin 81 MG tablet Take 81 mg by mouth daily.   atenolol 50 MG tablet Commonly known as: TENORMIN Take 50 mg by mouth daily.   calcium-vitamin D 500-200 MG-UNIT tablet Commonly known as: OSCAL WITH D Take 1 tablet by mouth daily.  colchicine 0.6 MG tablet Take 1 tablet (0.6 mg total) by mouth 2 (two) times daily.   CRANBERRY PO Take 2 capsules by mouth 2 (two) times daily.   ELDERBERRY PO Take 1-3 capsules by mouth See admin instructions. Takes 3 tablets in the morning and 1 at night   estradiol 0.1 MG/GM vaginal cream Commonly known as: ESTRACE Place 1 Applicatorful vaginally at bedtime.   fluconazole 100 MG tablet Commonly known as: DIFLUCAN Take 1 tablet (100 mg total) by mouth daily for 2 days. Start taking on: July 01, 2020   furosemide 20 MG tablet Commonly known as: LASIX Take 20 mg by mouth daily.   INFLECTRA IV Inject 1 each into the vein every 14 (fourteen) days.   multivitamin with minerals tablet Take 1 tablet by mouth daily.   nystatin 100000 UNIT/ML  suspension Commonly known as: MYCOSTATIN Take 5 mLs (500,000 Units total) by mouth every 6 (six) hours for 2 days.   ondansetron 4 MG disintegrating tablet Commonly known as: Zofran ODT Take 2 tablets (8 mg total) by mouth every 8 (eight) hours as needed for nausea or vomiting.   ondansetron 4 MG disintegrating tablet Commonly known as: Zofran ODT Take 1 tablet (4 mg total) by mouth every 4 (four) hours as needed for nausea or vomiting.   oxyCODONE 5 MG immediate release tablet Commonly known as: Oxy IR/ROXICODONE Take 1 tablet (5 mg total) by mouth every 4 (four) hours as needed for moderate pain.   pantoprazole 40 MG tablet Commonly known as: PROTONIX Take 40 mg by mouth 2 (two) times daily.   predniSONE 10 MG tablet Commonly known as: DELTASONE Take 1 tablet (10 mg total) by mouth 2 (two) times daily with a meal. Take 1 tablet 2 times daily x 1 week, then  daily until seen by GI. What changed: additional instructions   PROBIOTIC PO Take 1 capsule by mouth daily.   rosuvastatin 10 MG tablet Commonly known as: CRESTOR Take 1 tablet (10 mg total) by mouth daily. Start taking on: July 07, 2020 What changed: These instructions start on July 07, 2020. If you are unsure what to do until then, ask your doctor or other care provider.   sertraline 100 MG tablet Commonly known as: ZOLOFT Take 100 mg by mouth daily.   sucralfate 1 GM/10ML suspension Commonly known as: CARAFATE Take 10 mLs (1 g total) by mouth 4 (four) times daily -  with meals and at bedtime for 3 days.   SYSTANE BALANCE OP Place 2 drops into both eyes daily as needed (dry eyes).   telmisartan 40 MG tablet Commonly known as: MICARDIS Take 40 mg by mouth daily.   VITAMIN C PO Take 1-3 tablets by mouth See admin instructions. Takes 3 tablets in the morning and1 tablet at night   VITAMIN D PO Take 2 capsules by mouth daily.            Durable Medical Equipment  (From admission, onward)          Start     Ordered   06/28/20 1127  For home use only DME Bedside commode  Once       Question:  Patient needs a bedside commode to treat with the following condition  Answer:  Generalized weakness   06/28/20 1126         Allergies  Allergen Reactions  . Azathioprine     Other reaction(s): Fever (intolerance)  . Levofloxacin Other (See Comments)    Unknown  .  Nitrofuran Derivatives Other (See Comments)    Unknown  . Omeprazole Diarrhea  . Phenazopyridine Other (See Comments)    Unknown  . Decongest-Aid [Pseudoephedrine] Palpitations  . Loratadine Palpitations    Allergy to anithistamines  . Other Palpitations and Other (See Comments)    Antihistamine; Reactions: Tachycardia Decongestant; Reactions: Tachycardia    Contact information for follow-up providers    [facility] Follow up.   Specialty: Home Health Services Why: agency will provide home health physical and occupational therapy. Contact information: [address] [place] [address] [phone]        Erling Cruz., MD Follow up.   Specialty: Gastroenterology Why: f/u as scheduled Contact information: [address] [place] [address] [phone]        MD at snf Follow up.        orthopedics. Schedule an appointment as soon as possible for a visit in 2 week(s).   Why: f/u with your orthopod           Contact information for after-discharge care    Destination    [facility] Preferred SNF .   Service: Skilled Nursing Contact information: [address] [place] [address] [phone]                   The results of significant diagnostics from this hospitalization (including imaging, microbiology, ancillary and laboratory) are listed below for reference.    Significant Diagnostic Studies: DG Shoulder 1V Right  Result Date: 06/25/2020 CLINICAL DATA:  Right shoulder pain EXAM: RIGHT SHOULDER - 1 VIEW  COMPARISON:  01/31/2017 FINDINGS: There is no evidence of fracture or dislocation. Mild degenerative changes of the glenohumeral and acromioclavicular joints. Remote healed fracture of the right seventh rib. Soft tissues are unremarkable. IMPRESSION: Negative. Electronically Signed   By: Duanne Guess D.O.   On: 06/25/2020 12:04   DG Wrist 2 Views Left  Result Date: 06/25/2020 CLINICAL DATA:  Bilateral wrist pain without trauma. EXAM: LEFT WRIST - 2 VIEW COMPARISON:  None. FINDINGS: AP and lateral views. No acute fracture or dislocation. Osteopenia. Chondrocalcinosis in the triangular fibrocartilage. Degenerative changes including joint space narrowing and subchondral sclerosis involving the radiocarpal articulation and the radial aspect of the carpal bones. 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 Electronically Signed   By: Jeronimo Greaves M.D.   On: 06/25/2020 12:05   DG Wrist 2 Views Right  Result Date: 06/25/2020 CLINICAL DATA:  Right wrist pain EXAM: RIGHT WRIST - 2 VIEW COMPARISON:  None. FINDINGS: Subtle cortical irregularity of the distal scaphoid pole along its radial cortex, likely degenerative spurring. Nondisplaced fracture not excluded on the included projections. Borderline widening of the scapholunate interval. Degenerative changes at the first [facility] and triscaphe joints status post trapeziectomy. Radiocarpal joint space narrowing. Chondrocalcinosis of the TFC. Mild soft tissue swelling. IMPRESSION: 1. Subtle cortical irregularity of the distal scaphoid pole along its radial cortex, likely degenerative spurring. Nondisplaced fracture not excluded on the included projections. Correlate with point tenderness. 2. Otherwise, no acute osseous findings. 3. Scapholunate interval is upper limits of  normal which may reflect underlying ligamentous degeneration or tearing. Electronically Signed   By: Duanne Guess D.O.   On: 06/25/2020 12:08   MR WRIST RIGHT WO CONTRAST  Result Date: 06/27/2020 CLINICAL DATA:  Wrist pain EXAM: MR OF  THE RIGHT WRIST WITHOUT CONTRAST TECHNIQUE: Multiplanar, multisequence MR imaging of the right wrist was performed. No intravenous contrast was administered. COMPARISON:  None. FINDINGS: The study is somewhat limited due to technique. Alignment Ulnar variance:  None Distal radioulnar joint Congruent. Fluid A small distal radioulnar joint effusion is seen. Intrinsic ligaments Scapholunate ligament:  Intact. Lunotriquetral ligament:  Intact. Triangular fibrocartilage: I the TFCC appears to be attenuated, however grossly intact. Other: Normal Extensor compartment A small amount of fluid is seen surrounding extensor compartment 1 and 2 at the level of the proximal carpal row. There is also a small amount of fluid seen surrounding extensor carpi ulnaris tendon. Flexor compartment Carpal tunnel:  Normal. Median nerve:  Normal. Flexor retinaculum:  Normal. Flexor tendons:  Normal in position, signal,and configuration. Guyon canal:  Normal. Articulations Thumb carpometacarpal joint:  Normal. Pisiform-triquetral joint:  Normal. Lunate facets: Type 2 Other findings Bones: The patient has had a prior trapezii ectomy. Small cystic changes are seen throughout the carpals. However no bony erosive type change is seen. No areas of periosteal reaction or cortical destruction. Muscles:  Normal. Vessels:  Normal. Soft Tissues: Dorsal subcutaneous edema is seen throughout the wrist. No focal soft tissue mass or fluid collection. IMPRESSION: Small cystic degenerative changes are seen throughout the carpals. The patient is status post trapeziectomy. No acute osseous abnormality. Mild extensor compartment 1, 2, and extensor carpi ulnaris tenosynovitis Small radioulnar joint effusion Degenerative  changes of the TFCC, however does appear to be intact. Electronically Signed   By: Jonna Clark M.D.   On: 06/27/2020 22:59   DG Chest Port 1 View  Result Date: 06/25/2020 CLINICAL DATA:  Sepsis. EXAM: PORTABLE CHEST 1 VIEW COMPARISON:  04/28/2020 FINDINGS: 0512 hours. Cardiopericardial silhouette is at upper limits of normal for size. The lungs are clear without focal pneumonia, edema, pneumothorax or pleural effusion. Interstitial markings are diffusely coarsened with chronic features. Nonacute rib fractures noted bilaterally. Telemetry leads overlie the chest. IMPRESSION: Chronic interstitial coarsening without acute cardiopulmonary findings. Electronically Signed   By: Kennith Center M.D.   On: 06/25/2020 06:02    Microbiology: Recent Results (from the past 240 hour(s))  Urine culture     Status: Abnormal   Collection Time: 06/25/20  5:00 AM   Specimen: In/Out Cath Urine  Result Value Ref Range Status   Specimen Description   Final    IN/OUT CATH URINE Performed at [facility], [address] [place], [place] [address]    Special Requests   Final    NONE Performed at [facility], [address] [place], [place] [address]    Culture MULTIPLE SPECIES PRESENT, SUGGEST RECOLLECTION (A)  Final   Report Status 06/26/2020 FINAL  Final  Group A Strep by PCR     Status: None   Collection Time: 06/25/20  5:06 AM   Specimen: Throat; Sterile Swab  Result Value Ref Range Status   Group A Strep by PCR NOT DETECTED NOT DETECTED Final    Comment: Performed at [facility], [address] [place], [place] [address]  Blood Culture (routine x 2)     Status: None   Collection Time: 06/25/20  5:10 AM   Specimen: BLOOD LEFT WRIST  Result Value Ref Range Status   Specimen Description   Final    BLOOD LEFT WRIST Performed at [facility], [address] [place], [place] [address]    Special Requests  Final    BOTTLES DRAWN AEROBIC AND ANAEROBIC  Blood Culture adequate volume Performed at [facility], [address] [place], [place] [address]    Culture   Final    NO GROWTH 5 DAYS Performed at [facility], [address] [place], [place] [address]    Report Status 06/30/2020 FINAL  Final  Resp Panel by RT-PCR (Flu A&B, Covid) Nasopharyngeal Swab     Status: None   Collection Time: 06/25/20  5:12 AM   Specimen: Nasopharyngeal Swab; Nasopharyngeal(NP) swabs in vial transport medium  Result Value Ref Range Status   SARS Coronavirus 2 by RT PCR NEGATIVE NEGATIVE Final    Comment: (NOTE) SARS-CoV-2 target nucleic acids are NOT DETECTED.  The SARS-CoV-2 RNA is generally detectable in upper respiratory specimens during the acute phase of infection. The lowest concentration of SARS-CoV-2 viral copies this assay can detect is 138 copies/mL. A negative result does not preclude SARS-Cov-2 infection and should not be used as the sole basis for treatment or other patient management decisions. A negative result may occur with  improper specimen collection/handling, submission of specimen other than nasopharyngeal swab, presence of viral mutation(s) within the areas targeted by this assay, and inadequate number of viral copies(<138 copies/mL). A negative result must be combined with clinical observations, patient history, and epidemiological information. The expected result is Negative.  Fact Sheet for Patients:  BloggerCourse.com  Fact Sheet for Healthcare Providers:  SeriousBroker.it  This test is no t yet approved or cleared by the [place] FDA and  has been authorized for detection and/or diagnosis of SARS-CoV-2 by FDA under an Emergency Use Authorization (EUA). This EUA will remain  in effect (meaning this test can be used) for the duration of the COVID-19 declaration under Section 564(b)(1) of the Act, 21 U.S.C.section 360bbb-3(b)(1), unless the  authorization is terminated  or revoked sooner.       Influenza A by PCR NEGATIVE NEGATIVE Final   Influenza B by PCR NEGATIVE NEGATIVE Final    Comment: (NOTE) The Xpert Xpress SARS-CoV-2/FLU/RSV plus assay is intended as an aid in the diagnosis of influenza from Nasopharyngeal swab specimens and should not be used as a sole basis for treatment. Nasal washings and aspirates are unacceptable for Xpert Xpress SARS-CoV-2/FLU/RSV testing.  Fact Sheet for Patients: BloggerCourse.com  Fact Sheet for Healthcare Providers: SeriousBroker.it  This test is not yet approved or cleared by the [place] FDA and has been authorized for detection and/or diagnosis of SARS-CoV-2 by FDA under an Emergency Use Authorization (EUA). This EUA will remain in effect (meaning this test can be used) for the duration of the COVID-19 declaration under Section 564(b)(1) of the Act, 21 U.S.C. section 360bbb-3(b)(1), unless the authorization is terminated or revoked.  Performed at [facility], [address] [place], [place] [address]   Blood Culture (routine x 2)     Status: None   Collection Time: 06/25/20  5:29 AM   Specimen: BLOOD RIGHT FOREARM  Result Value Ref Range Status   Specimen Description   Final    BLOOD RIGHT FOREARM Performed at [facility], [address] [place], [place] [address]    Special Requests   Final    BOTTLES DRAWN AEROBIC AND ANAEROBIC Blood Culture adequate volume Performed at [facility], [address] [place], [place] [address]    Culture   Final    NO GROWTH 5 DAYS Performed at  [facility], [address] [place], [place] [address]    Report Status 06/30/2020 FINAL  Final  MRSA PCR Screening     Status: None   Collection Time: 06/25/20 12:41 PM   Specimen: Nasal Mucosa; Nasopharyngeal  Result Value Ref Range Status   MRSA by PCR NEGATIVE NEGATIVE Final     Comment:        The GeneXpert MRSA Assay (FDA approved for NASAL specimens only), is one component of a comprehensive MRSA colonization surveillance program. It is not intended to diagnose MRSA infection nor to guide or monitor treatment for MRSA infections. Performed at [facility], [address] [place], [place] [address]   Respiratory Panel by PCR     Status: None   Collection Time: 06/25/20 12:41 PM   Specimen: Nasopharyngeal Swab; Respiratory  Result Value Ref Range Status   Adenovirus NOT DETECTED NOT DETECTED Final   Coronavirus 229E NOT DETECTED NOT DETECTED Final    Comment: (NOTE) The Coronavirus on the Respiratory Panel, DOES NOT test for the novel  Coronavirus (2019 nCoV)    Coronavirus HKU1 NOT DETECTED NOT DETECTED Final   Coronavirus NL63 NOT DETECTED NOT DETECTED Final   Coronavirus OC43 NOT DETECTED NOT DETECTED Final   Metapneumovirus NOT DETECTED NOT DETECTED Final   Rhinovirus / Enterovirus NOT DETECTED NOT DETECTED Final   Influenza A NOT DETECTED NOT DETECTED Final   Influenza B NOT DETECTED NOT DETECTED Final   Parainfluenza Virus 1 NOT DETECTED NOT DETECTED Final   Parainfluenza Virus 2 NOT DETECTED NOT DETECTED Final   Parainfluenza Virus 3 NOT DETECTED NOT DETECTED Final   Parainfluenza Virus 4 NOT DETECTED NOT DETECTED Final   Respiratory Syncytial Virus NOT DETECTED NOT DETECTED Final   Bordetella pertussis NOT DETECTED NOT DETECTED Final   Bordetella Parapertussis NOT DETECTED NOT DETECTED Final   Chlamydophila pneumoniae NOT DETECTED NOT DETECTED Final   Mycoplasma pneumoniae NOT DETECTED NOT DETECTED Final    Comment: Performed at [facility], [address] [place], [place] [address]  Resp Panel by RT-PCR (Flu A&B, Covid) Nasopharyngeal Swab     Status: None   Collection Time: 06/30/20 12:21 PM   Specimen: Nasopharyngeal Swab; Nasopharyngeal(NP) swabs in vial transport medium  Result Value Ref Range Status   SARS  Coronavirus 2 by RT PCR NEGATIVE NEGATIVE Final    Comment: (NOTE) SARS-CoV-2 target nucleic acids are NOT DETECTED.  The SARS-CoV-2 RNA is generally detectable in upper respiratory specimens during the acute phase of infection. The lowest concentration of SARS-CoV-2 viral copies this assay can detect is 138 copies/mL. A negative result does not preclude SARS-Cov-2 infection and should not be used as the sole basis for treatment or other patient management decisions. A negative result may occur with  improper specimen collection/handling, submission of specimen other than nasopharyngeal swab, presence of viral mutation(s) within the areas targeted by this assay, and inadequate number of viral copies(<138 copies/mL). A negative result must be combined with clinical observations, patient history, and epidemiological information. The expected result is Negative.  Fact Sheet for Patients:  BloggerCourse.com  Fact Sheet for Healthcare Providers:  SeriousBroker.it  This test is no t yet approved or cleared by the [place] FDA and  has been authorized for detection and/or diagnosis of SARS-CoV-2 by FDA under an Emergency Use Authorization (EUA). This EUA will remain  in effect (meaning this test can be used) for the duration of the COVID-19 declaration under Section 564(b)(1)  of the Act, 21 U.S.C.section 360bbb-3(b)(1), unless the authorization is terminated  or revoked sooner.       Influenza A by PCR NEGATIVE NEGATIVE Final   Influenza B by PCR NEGATIVE NEGATIVE Final    Comment: (NOTE) The Xpert Xpress SARS-CoV-2/FLU/RSV plus assay is intended as an aid in the diagnosis of influenza from Nasopharyngeal swab specimens and should not be used as a sole basis for treatment. Nasal washings and aspirates are unacceptable for Xpert Xpress SARS-CoV-2/FLU/RSV testing.  Fact Sheet for  Patients: BloggerCourse.com  Fact Sheet for Healthcare Providers: SeriousBroker.it  This test is not yet approved or cleared by the [place] FDA and has been authorized for detection and/or diagnosis of SARS-CoV-2 by FDA under an Emergency Use Authorization (EUA). This EUA will remain in effect (meaning this test can be used) for the duration of the COVID-19 declaration under Section 564(b)(1) of the Act, 21 U.S.C. section 360bbb-3(b)(1), unless the authorization is terminated or revoked.  Performed at [facility], [address] [place], [place] [address]      Labs: Basic Metabolic Panel: Recent Labs  Lab 06/25/20 0510 06/25/20 1208 06/26/20 0516 06/26/20 0957 06/27/20 0433 06/29/20 0247 06/30/20 0312  NA 140  --  138  --  138 141 140  K 3.0*  --  3.0*  --  3.7 3.5 3.5  CL 103  --  102  --  107 111 107  CO2 27  --  25  --  23 23 26   GLUCOSE 92  --  108*  --  156* 153* 150*  BUN 17  --  10  --  18 16 14   CREATININE 0.82  --  0.73  --  0.80 0.68 0.73  CALCIUM 9.3  --  8.5*  --  8.2* 8.3* 8.6*  MG  --  2.0  --  1.7  --   --  2.2   Liver Function Tests: Recent Labs  Lab 06/25/20 0510 06/26/20 0516  AST 20 20  ALT 32 28  ALKPHOS 47 44  BILITOT 0.7 1.6*  PROT 5.9* 5.7*  ALBUMIN 3.5 3.1*   No results for input(s): LIPASE, AMYLASE in the last 168 hours. No results for input(s): AMMONIA in the last 168 hours. CBC: Recent Labs  Lab 06/25/20 0510 06/26/20 0516 06/29/20 0247 06/30/20 0312  WBC 9.3 7.1 4.0 4.7  NEUTROABS 6.7  --   --  3.2  HGB 14.3 13.9 11.4* 11.9*  HCT 41.9 42.3 36.0 36.4  MCV 96.8 99.5 101.7* 98.6  PLT 118* PLATELET CLUMPS NOTED ON SMEAR, UNABLE TO ESTIMATE 113* 115*   Cardiac Enzymes: Recent Labs  Lab 06/26/20 0957  CKTOTAL 39   BNP: BNP (last 3 results) No results for input(s): BNP in the last 8760 hours.  ProBNP (last 3 results) No results for input(s):  PROBNP in the last 8760 hours.  CBG: No results for input(s): GLUCAP in the last 168 hours.     Signed:  07/02/20 MD.  [facility] 06/30/2020, 3:06 PM

## 2020-07-04 ENCOUNTER — Non-Acute Institutional Stay: Payer: Self-pay

## 2020-07-04 ENCOUNTER — Non-Acute Institutional Stay (SKILLED_NURSING_FACILITY): Payer: Medicare HMO | Admitting: Orthopedic Surgery

## 2020-07-04 DIAGNOSIS — K50919 Crohn's disease, unspecified, with unspecified complications: Secondary | ICD-10-CM

## 2020-07-04 DIAGNOSIS — M2550 Pain in unspecified joint: Secondary | ICD-10-CM | POA: Diagnosis not present

## 2020-07-04 DIAGNOSIS — I10 Essential (primary) hypertension: Secondary | ICD-10-CM

## 2020-07-04 DIAGNOSIS — B370 Candidal stomatitis: Secondary | ICD-10-CM

## 2020-07-04 DIAGNOSIS — K219 Gastro-esophageal reflux disease without esophagitis: Secondary | ICD-10-CM

## 2020-07-04 DIAGNOSIS — R1310 Dysphagia, unspecified: Secondary | ICD-10-CM

## 2020-07-04 DIAGNOSIS — R531 Weakness: Secondary | ICD-10-CM

## 2020-07-04 NOTE — Progress Notes (Signed)
Location[facility] Provider: Hazle Nordmann, AGNP-C     Tiffany L. Renato Gails, D.O., C.M.D.   Goals of Care:  Advanced Directives 06/25/2020  Does Patient Have a Medical Advance Directive? Yes  Type of Advance Directive Living will  Does patient want to make changes to medical advance directive? No - Patient declined  Would patient like information on creating a medical advance directive? -     No chief complaint on file.   HPI: Patient is a 75 y.o. female seen today for hospital follow-up s/p admission from [facility] 12/25- 12/30. PMH includes: Chron's, GERD, hyperlipidemia, hypertension, and osteoporosis.   Symptoms of bilateral wrist pain, right shoulder pain, and sore throat began 12/24. She presented to [facility] ED 12/25 when she became increasingly weak and had trouble ambulating to bathroom. In the ED, she had a fever of 100.6. UA unremarkable. CXR unremarkable. Covid negative. Vancomycin and cefepime started, procal 0.10. In addition, she had a rash on the palms of her hands, joint pain at multiple sites and thrombocytopenia. She was thought to possibly have RMSF, but she denied any exposure, doxy added to regimen. She had a history of taking steroids, reported she recently stopped a taper following Infliximab. Concerned her symptoms may have been caused to taking Infliximab a second time. Solumedrol 40 mg given daily. Her sore throat was suspected to be related to Chron's and GI recommended nystatin, Carafate and Protonix.   Her polyarthralgia was r/o as infectious by orthopedics. CRP elevated. MRI wrist negative for fracture but showed small cystic degenerative changes within the carpals, mild extensor compartment and extensor carpi ulnar wrist T no synovitis, small radial ulnar joint effusion. Prednisone 10 mg bid started, then reduced to daily. Recommended continuing prednisone 10 mg daily until f/u with GI and orthopod. RMSF IgM negative. Blood  cultures negative. Doxy was discontinued. Oral thrush treated with nystatin and Carafate. She was discharged with 2 days diflucan, oral nystatin and 3 more days Carafate. Oxygen weaned off. SNF was recommended for additional PT/OT and skilled nursing services.   Today, she is alert and oriented x 4. Follows commands and can express needs. She is ambulating with a front wheeled walker to bathroom on her own. Ambulating 150 ft with supervision. She denies any wrist, shoulder or throat pain at this time. Upset she has gained weight while taking prednisone. Hopes to be off Prednisone in the future, but understands her biologic to treat Chron's may not be a possibility. She hopes to go home tomorrow. She is worried with her immunocompromised state, she is susceptible to catch something.   MOST form discussed with patient during encounter. At this time she does not want to fill it out. States she is leaving in 1-2 days, does not see the importance of filling it out. Advanced directive is at home. Husband has been advised to bring copy to facility.      Past Medical History:  Diagnosis Date  . Crohn disease (HCC)   . Diverticulosis   . Dyslipidemia   . GERD (gastroesophageal reflux disease)   . Hypertension   . Osteoporosis     Past Surgical History:  Procedure Laterality Date  . HAND SURGERY Right   . NASAL SINUS SURGERY    . REPLACEMENT TOTAL KNEE Right     Allergies  Allergen Reactions  . Azathioprine     Other reaction(s): Fever (intolerance)  . Levofloxacin Other (See Comments)    Unknown  . Nitrofuran  Derivatives Other (See Comments)    Unknown  . Omeprazole Diarrhea  . Phenazopyridine Other (See Comments)    Unknown  . Decongest-Aid [Pseudoephedrine] Palpitations  . Loratadine Palpitations    Allergy to anithistamines  . Other Palpitations and Other (See Comments)    Antihistamine; Reactions: Tachycardia Decongestant; Reactions: Tachycardia    Outpatient Encounter  Medications as of 07/04/2020  Medication Sig  . Ascorbic Acid (VITAMIN C PO) Take 1-3 tablets by mouth See admin instructions. Takes 3 tablets in the morning and1 tablet at night  . aspirin 81 MG tablet Take 81 mg by mouth daily.  Marland Kitchen atenolol (TENORMIN) 50 MG tablet Take 50 mg by mouth daily.  . calcium-vitamin D (OSCAL WITH D) 500-200 MG-UNIT tablet Take 1 tablet by mouth daily.   . Cholecalciferol (VITAMIN D PO) Take 2 capsules by mouth daily.  . colchicine 0.6 MG tablet Take 1 tablet (0.6 mg total) by mouth 2 (two) times daily.  Marland Kitchen CRANBERRY PO Take 2 capsules by mouth 2 (two) times daily.   Marland Kitchen ELDERBERRY PO Take 1-3 capsules by mouth See admin instructions. Takes 3 tablets in the morning and 1 at night  . estradiol (ESTRACE) 0.1 MG/GM vaginal cream Place 1 Applicatorful vaginally at bedtime.  . furosemide (LASIX) 20 MG tablet Take 20 mg by mouth daily.  Marland Kitchen inFLIXimab-dyyb (INFLECTRA IV) Inject 1 each into the vein every 14 (fourteen) days.  . Multiple Vitamins-Minerals (MULTIVITAMIN WITH MINERALS) tablet Take 1 tablet by mouth daily.  . ondansetron (ZOFRAN ODT) 4 MG disintegrating tablet Take 1 tablet (4 mg total) by mouth every 4 (four) hours as needed for nausea or vomiting. (Patient not taking: No sig reported)  . ondansetron (ZOFRAN-ODT) 4 MG disintegrating tablet Take 2 tablets (8 mg total) by mouth every 8 (eight) hours as needed for nausea or vomiting. (Patient not taking: No sig reported)  . oxyCODONE (OXY IR/ROXICODONE) 5 MG immediate release tablet Take 1 tablet (5 mg total) by mouth every 4 (four) hours as needed for moderate pain.  . pantoprazole (PROTONIX) 40 MG tablet Take 40 mg by mouth 2 (two) times daily.  . predniSONE (DELTASONE) 10 MG tablet Take 1 tablet (10 mg total) by mouth 2 (two) times daily with a meal. Take 1 tablet 2 times daily x 1 week, then 10mg  daily until seen by GI.  Probiotic Product (PROBIOTIC PO) Take 1 capsule by mouth daily.  Marland Kitchen Propylene Glycol (SYSTANE  BALANCE OP) Place 2 drops into both eyes daily as needed (dry eyes).  Marland Kitchen ON 07/07/2020] rosuvastatin (CRESTOR) 10 MG tablet Take 1 tablet (10 mg total) by mouth daily.  . sertraline (ZOLOFT) 100 MG tablet Take 100 mg by mouth daily.   . sucralfate (CARAFATE) 1 GM/10ML suspension Take 10 mLs (1 g total) by mouth 4 (four) times daily -  with meals and at bedtime for 3 days.  09/04/2020 telmisartan (MICARDIS) 40 MG tablet Take 40 mg by mouth daily.   No facility-administered encounter medications on file as of 07/04/2020.    Review of Systems:  Review of Systems  Constitutional: Negative for fever, malaise/fatigue and weight loss.       Weight gain due to steroids  HENT: Negative for hearing loss and sore throat.   Eyes: Negative for photophobia.  Respiratory: Negative for cough, shortness of breath and wheezing.   Cardiovascular: Negative for chest pain and leg swelling.  Gastrointestinal: Negative for abdominal pain, constipation, heartburn and vomiting.  Chron's  Genitourinary: Negative for dysuria and hematuria.  Musculoskeletal: Positive for joint pain and myalgias. Negative for falls.  Neurological: Positive for weakness. Negative for dizziness and headaches.  Psychiatric/Behavioral: Negative for depression. The patient is not nervous/anxious and does not have insomnia.     Health Maintenance  Topic Date Due  . TETANUS/TDAP  Never done  . COLONOSCOPY (Pts 45-73yrs Insurance coverage will need to be confirmed)  Never done  . DEXA SCAN  Never done  . INFLUENZA VACCINE  Completed  . COVID-19 Vaccine  Completed  . Hepatitis C Screening  Completed  . PNA vac Low Risk Adult  Completed    Physical Exam: There were no vitals filed for this visit. There is no height or weight on file to calculate BMI. Physical Exam Vitals reviewed.  Constitutional:      General: She is not in acute distress.    Appearance: Normal appearance.  HENT:     Head: Normocephalic and atraumatic.      Mouth/Throat:     Mouth: Mucous membranes are moist.     Pharynx: No posterior oropharyngeal erythema.  Eyes:     Conjunctiva/sclera: Conjunctivae normal.     Pupils: Pupils are equal, round, and reactive to light.  Cardiovascular:     Rate and Rhythm: Normal rate and regular rhythm.     Pulses: Normal pulses.     Heart sounds: Normal heart sounds.  Pulmonary:     Effort: Pulmonary effort is normal.     Breath sounds: Normal breath sounds.  Abdominal:     General: Bowel sounds are normal. There is no distension.     Palpations: Abdomen is soft.     Tenderness: There is no abdominal tenderness.  Musculoskeletal:        General: No tenderness. Normal range of motion.     Comments: Right and left wrist with no swelling, FROM, no pain or tenderness throughout exam. Right shoulder with no effusion, FROM, no pain or tenderness noted.   Skin:    General: Skin is warm and dry.     Capillary Refill: Capillary refill takes less than 2 seconds.     Comments: Left buttock with nickel sized excoriated area. Skin dry and flaking, no drainage. Cream applied and covered with foam dressing.   Neurological:     Mental Status: She is alert and oriented to person, place, and time.     Motor: Weakness present.     Gait: Gait abnormal.  Psychiatric:        Mood and Affect: Mood normal.        Behavior: Behavior normal.     Labs reviewed: Basic Metabolic Panel: Recent Labs    06/25/20 1208 06/26/20 0516 06/26/20 0957 06/27/20 0433 06/29/20 0247 06/30/20 0312  NA  --    < >  --  138 141 140  K  --    < >  --  3.7 3.5 3.5  CL  --    < >  --  107 111 107  CO2  --    < >  --  23 23 26   GLUCOSE  --    < >  --  156* 153* 150*  BUN  --    < >  --  18 16 14   CREATININE  --    < >  --  0.80 0.68 0.73  CALCIUM  --    < >  --  8.2* 8.3* 8.6*  MG 2.0  --  1.7  --   --  2.2   < > = values in this interval not displayed.   Liver Function Tests: Recent Labs    01/25/20 1704 06/25/20 0510  06/26/20 0516  AST 21 20 20  ALT 22 32 28  ALKPHOS 50 47 44  BILITOT 0.8 0.7 1.6*  PROT 6.8 5.9* 5.7*  ALBUMIN 4.1 3.5 3.1*   No results for input(s): LIPASE, AMYLASE in the last 8760 hours. No results for input(s): AMMONIA in the last 8760 hours. CBC: Recent Labs    01/25/20 1704 06/25/20 0510 06/26/20 0516 06/29/20 0247 06/30/20 0312  WBC 9.2 9.3 7.1 4.0 4.7  NEUTROABS 6.9 6.7  --   --  3.2  HGB 14.5 14.3 13.9 11.4* 11.9*  HCT 45.6 41.9 42.3 36.0 36.4  MCV 97.6 96.8 99.5 101.7* 98.6  PLT 225 118* PLATELET CLUMPS NOTED ON SMEAR, UNABLE TO ESTIMATE 113* 115*   Lipid Panel: No results for input(s): CHOL, HDL, LDLCALC, TRIG, CHOLHDL, LDLDIRECT in the last 8760 hours. No results found for: HGBA1C  Procedures since last visit: DG Shoulder 1V Right  Result Date: 06/25/2020 CLINICAL DATA:  Right shoulder pain EXAM: RIGHT SHOULDER - 1 VIEW COMPARISON:  01/31/2017 FINDINGS: There is no evidence of fracture or dislocation. Mild degenerative changes of the glenohumeral and acromioclavicular joints. Remote healed fracture of the right seventh rib. Soft tissues are unremarkable. IMPRESSION: Negative. Electronically Signed   By: Nicholas  Plundo D.O.   On: 06/25/2020 12:04   DG Wrist 2 Views Left  Result Date: 06/25/2020 CLINICAL DATA:  Bilateral wrist pain without trauma. EXAM: LEFT WRIST - 2 VIEW COMPARISON:  None. FINDINGS: AP and lateral views. No acute fracture or dislocation. Osteopenia. Chondrocalcinosis in the triangular fibrocartilage. Degenerative changes including joint space narrowing and subchondral sclerosis involving the radiocarpal articulation and the radial aspect of the carpal bones. 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 Electronically Signed   By: Kyle  Talbot M.D.   On:  06/25/2020 12:05   DG Wrist 2 Views Right  Result Date: 06/25/2020 CLINICAL DATA:  Right wrist pain EXAM: RIGHT WRIST - 2 VIEW COMPARISON:  None. FINDINGS: Subtle cortical irregularity of the distal scaphoid pole along its radial cortex, likely degenerative spurring. Nondisplaced fracture not excluded on the included projections. Borderline widening of the scapholunate interval. Degenerative changes at the first CMC and triscaphe joints status post trapeziectomy. Radiocarpal joint space narrowing. Chondrocalcinosis of the TFC. Mild soft tissue swelling. IMPRESSION: 1. Subtle cortical irregularity of the distal scaphoid pole along its radial cortex, likely degenerative spurring. Nondisplaced fracture not excluded on the included projections. Correlate with point tenderness. 2. Otherwise, no acute osseous findings. 3. Scapholunate interval is upper limits of normal which may reflect underlying ligamentous degeneration or tearing. Electronically Signed   By: Nicholas  Plundo D.O.   On: 06/25/2020 12:08   DG Chest Port 1 View  Result Date: 06/25/2020 CLINICAL DATA:  Sepsis. EXAM: PORTABLE CHEST 1 VIEW COMPARISON:  04/28/2020 FINDINGS: 0512 hours. Cardiopericardial silhouette is at upper limits of normal for size. The lungs are clear without focal pneumonia, edema, pneumothorax or pleural effusion. Interstitial markings are diffusely coarsened with chronic features. Nonacute rib fractures noted bilaterally. Telemetry leads overlie the chest. IMPRESSION: Chronic interstitial coarsening without acute cardiopulmonary findings. Electronically Signed   By: Eric  Mansell M.D.   On: 06/25/2020 06:02    Assessment/Plan: 1.   Generalized weakness - improved with PT/OT - ambulating 150 ft with FWW - continue PT/OT sessions  2. Gastroesophageal reflux disease without esophagitis - stable, continue Protonix 40 mg po bid  3. Crohn's disease with complication, unspecified gastrointestinal tract location [facility]) -  followed by Dr. Truman Hayward - receiving inflectra injections every 14 days for Chron's - advised to continue prednisone 10 mg daily until f/u - she denies flare up at this time - left buttocks with excoriated area due to recent flare, continue to apply antibiotic ointment and cover with foam dressing  4. Polyarthralgia - stable with prednisone regimen - ortho r/u infectious - she remains afebrile, with no swelling observed at wrists or right shoulder - continue f/u with orthopedist  5. Oral candidiasis - suspect sore throat caused by candida - followed by GI, continue nystatin and Carafate - continue diflucan 100 mg x 2 days- complete 12/31  6. Dysphagia, unspecified type -improved, she can swallow without difficulty - ST recommends regular diet with thin liquids  7. Primary hypertension - bp at goal < 150/90 - continue lasix, micardis, and atenolol - cbc/diff in 1 week - bmp in 1 week    Labs/tests ordered: cbc/diff, bmp  Next appt:  Visit date not found  Windell Moulding, AGNP-C [facility] [address], [place] [address] On Call:  [phone] & follow prompts after 5pm & weekends Office Phone:  [phone] Office Fax:  [phone]

## 2020-07-05 ENCOUNTER — Encounter: Payer: Self-pay | Admitting: Internal Medicine

## 2020-07-05 ENCOUNTER — Non-Acute Institutional Stay (SKILLED_NURSING_FACILITY): Payer: Medicare HMO | Admitting: Internal Medicine

## 2020-07-05 DIAGNOSIS — I10 Essential (primary) hypertension: Secondary | ICD-10-CM

## 2020-07-05 DIAGNOSIS — K50919 Crohn's disease, unspecified, with unspecified complications: Secondary | ICD-10-CM

## 2020-07-05 DIAGNOSIS — K219 Gastro-esophageal reflux disease without esophagitis: Secondary | ICD-10-CM

## 2020-07-05 DIAGNOSIS — B370 Candidal stomatitis: Secondary | ICD-10-CM

## 2020-07-05 DIAGNOSIS — R531 Weakness: Secondary | ICD-10-CM

## 2020-07-05 DIAGNOSIS — K5090 Crohn's disease, unspecified, without complications: Secondary | ICD-10-CM

## 2020-07-05 DIAGNOSIS — L932 Other local lupus erythematosus: Secondary | ICD-10-CM

## 2020-07-05 DIAGNOSIS — D696 Thrombocytopenia, unspecified: Secondary | ICD-10-CM

## 2020-07-05 DIAGNOSIS — D84821 Immunodeficiency due to drugs: Secondary | ICD-10-CM

## 2020-07-05 DIAGNOSIS — T50905A Adverse effect of unspecified drugs, medicaments and biological substances, initial encounter: Secondary | ICD-10-CM

## 2020-07-05 DIAGNOSIS — Z79899 Other long term (current) drug therapy: Secondary | ICD-10-CM

## 2020-07-05 DIAGNOSIS — M2550 Pain in unspecified joint: Secondary | ICD-10-CM

## 2020-07-05 NOTE — Progress Notes (Signed)
Provider:  Gwenith Spitz. Renato Gails, D.O., C.M.D. Location:  [facility] Room Number: 100 Place of Service:  SNF (31)  PCP: Elsie Amis, MD Patient Care Team: Elsie Amis, MD as PCP - General (Internal Medicine) Erling Cruz., MD as Referring Physician (Gastroenterology)  Extended Emergency Contact Information Primary Emergency Contact: Bergman,JAMES Address: [address]          [place],  [address] Home Phone: [phone] Mobile Phone: [phone] Relation: Spouse  Code Status: FULL Goals of Care: Advanced Directive information Advanced Directives 07/05/2020  Does Patient Have a Medical Advance Directive? Yes  Type of Advance Directive Living will  Does patient want to make changes to medical advance directive? No - Patient declined  Would patient like information on creating a medical advance directive? -      Chief Complaint  Patient presents with  . New Admit To SNF    New Admit to [facility] SN[facility]     HPI: Patient is a 75 y.o. female seen today for admission to [facility] status post hospitalization from December 25 to December 30 at [facility] with a chief complaint of wrist pain.  She has a past medical history significant for Crohn's disease, gastroesophageal reflux disease, osteoporosis hyperlipidemia, hypertension.  Per her H&P she had presented with bilateral wrist pain, right shoulder pain and a sore throat.  Sore throat made it hard for her to swallow pills and solids but she did not take any medications at home.  She decided to go to bed and woke up in the night with pain and with general weakness.  In the emergency department she had a temperature of 100.6.  Her urinalysis was unremarkable.  Her chest x-ray was also unremarkable (reading actually says chronic interstitial coarsening without acute cardiopulmonary findings) but she had a new oxygen requirement.  Her Covid test was negative.  She was started on  vancomycin and cefepime empirically.  Her white blood cell count was just 9.3.  She had mild hypokalemia at 3. She had some interesting findings of a rash on the palms of her hands and thrombocytopenia along with her joint pain.  She had no exposure to suggest [facility] spotted fever but these tests were ordered and doxycycline was added to her regimen.  She was also felt possibly to have an arthritis flare and she was given Solu-Medrol 40 mg daily.  [facility] was also involved and recommended nystatin swish and swallow, Carafate, and Protonix for her odynophagia.  She is on Humira and prednisone for her Crohn's disease.  She also has COPD and has been an ongoing smoker.  Her CRP was noted to be elevated.  Her right wrist was swollen and tender but did improve after the Solu-Medrol.  She was placed back on prednisone 10 mg p.o. twice daily for a week and then daily until follow-up with orthopedics (Dr. Aundria Rud saw her at the hospital.)  SNF was recommended and patient initially refused and requested to go home with home health.  Orthopedics did not feel that her right wrist swelling was due to a septic arthritis.  Also discharged on colchicine along with prednisone.  [facility] spotted fever IgM was negative, procalcitonin was negative.  Doxycycline was discontinued.  Blood cultures remain negative to date.  He did receive 5 days of IV cefepime.  She did have clinical improvement of her oral thrush with the nystatin and Carafate and was to complete 2  more days of Diflucan and 3 more days of Carafate.  Her infliximab for Crohn's disease was on hold.  She is to follow-up outpatient with GI--Dr. Mitchell Heir on [facility] in [place]..  It appears that perhaps her ARB was changed due to formulary presumably.  I was asked to see her today b/c her husband wants to take her home due to covid cases here at [facility].    When seen, she has been doing well with therapy.  She's walked on stairs, is able to do her  daily activities.  Her husband has her home med list and has all of her meds at home.  She is now down to 10mg  daily of prednisone for this week, then to do 5mg  daily after that per Dr. Marin Comment, her GI doctor.  She has plans to see him.  Her husband brought up a quite reasonable conclusion that she may have had a delayed reaction to her infliximab-dyyb treatments that she received on 12/6 and 12/20.  Her symptoms had begun 12/24 and seem consistent with a drug-induced lupus type reaction which is listed in the package insert as a possibility.  She had marked improvement when her prednisone was resumed suggesting some possible adrenal insufficiency also when she did not get her prednisone at the hospital that she's been on for many months (caused her profound weakness portion of her symptoms).  She is doing much better now.  Bowels are moving but not having any difference from her baseline of unpredictable occasional loose stools.  She's using a walker for mobility.  She's eager to get home in familiar surroundings.  Past Medical History:  Diagnosis Date  . Crohn disease ([place])   . Diverticulosis   . Dyslipidemia   . GERD (gastroesophageal reflux disease)   . Hypertension   . Osteoporosis    Past Surgical History:  Procedure Laterality Date  . HAND SURGERY Right   . NASAL SINUS SURGERY    . REPLACEMENT TOTAL KNEE Right     Social History   Socioeconomic History  . Marital status: Married    Spouse name: Not on file  . Number of children: Not on file  . Years of education: Not on file  . Highest education level: Not on file  Occupational History  . Not on file  Tobacco Use  . Smoking status: Current Every Day Smoker  . Smokeless tobacco: Never Used  Substance and Sexual Activity  . Alcohol use: No  . Drug use: No  . Sexual activity: Not on file  Other Topics Concern  . Not on file  Social History Narrative  . Not on file   Social Determinants of Health   Financial Resource Strain:  Not on file  Food Insecurity: Not on file  Transportation Needs: Not on file  Physical Activity: Not on file  Stress: Not on file  Social Connections: Not on file    reports that she has been smoking. She has never used smokeless tobacco. She reports that she does not drink alcohol and does not use drugs.  Functional Status Survey:    Family History  Problem Relation Age of Onset  . Hypertension Other     Health Maintenance  Topic Date Due  . COLONOSCOPY (Pts 45-39yrs Insurance coverage will need to be confirmed)  Never done  . DEXA SCAN  Never done  . TETANUS/TDAP  05/31/2019  . COVID-19 Vaccine (4 - Booster for Pfizer series) 10/29/2020  . INFLUENZA VACCINE  Completed  . Hepatitis C Screening  Completed  . PNA vac Low Risk Adult  Completed    Allergies  Allergen Reactions  . Azathioprine     Other reaction(s): Fever (intolerance)  . Levofloxacin Other (See Comments)    Unknown  . Nitrofuran Derivatives Other (See Comments)    Unknown  . Omeprazole Diarrhea  . Phenazopyridine Other (See Comments)    Unknown  . Decongest-Aid [Pseudoephedrine] Palpitations  . Loratadine Palpitations    Allergy to anithistamines  . Other Palpitations and Other (See Comments)    Antihistamine; Reactions: Tachycardia Decongestant; Reactions: Tachycardia    Outpatient Encounter Medications as of 07/05/2020  Medication Sig  . Ascorbic Acid (VITAMIN C PO) Take 1-3 tablets by mouth See admin instructions. Takes 3 tablets in the morning and1 tablet at night  . aspirin 81 MG tablet Take 81 mg by mouth daily.  Marland Kitchen atenolol (TENORMIN) 50 MG tablet Take 50 mg by mouth daily.  . bisacodyl (DULCOLAX) 10 MG suppository Place 10 mg rectally as needed for moderate constipation.  . calcium-vitamin D (OSCAL WITH D) 500-200 MG-UNIT tablet Take 1 tablet by mouth daily.   . Cholecalciferol (VITAMIN D PO) Take 2 capsules by mouth daily.  . colchicine 0.6 MG tablet Take 1 tablet (0.6 mg total) by mouth 2  (two) times daily.  Marland Kitchen CRANBERRY PO Take 2 capsules by mouth 2 (two) times daily.   Marland Kitchen ELDERBERRY PO Take 1-3 capsules by mouth See admin instructions. Takes 3 tablets in the morning and 1 at night  . estradiol (ESTRACE) 0.1 MG/GM vaginal cream Place 1 Applicatorful vaginally at bedtime.  . furosemide (LASIX) 20 MG tablet Take 20 mg by mouth daily.  Marland Kitchen inFLIXimab-dyyb (INFLECTRA IV) Inject 1 each into the vein every 14 (fourteen) days.  . magnesium hydroxide (MILK OF MAGNESIA) 400 MG/5ML suspension Take by mouth daily as needed for mild constipation.  . Multiple Vitamins-Minerals (MULTIVITAMIN WITH MINERALS) tablet Take 1 tablet by mouth daily.  . ondansetron (ZOFRAN ODT) 4 MG disintegrating tablet Take 1 tablet (4 mg total) by mouth every 4 (four) hours as needed for nausea or vomiting.  . ondansetron (ZOFRAN-ODT) 4 MG disintegrating tablet Take 2 tablets (8 mg total) by mouth every 8 (eight) hours as needed for nausea or vomiting.  . pantoprazole (PROTONIX) 40 MG tablet Take 40 mg by mouth 2 (two) times daily.  . predniSONE (DELTASONE) 10 MG tablet Take 1 tablet (10 mg total) by mouth 2 (two) times daily with a meal. Take 1 tablet 2 times daily x 1 week, then 10mg  daily until seen by GI.  Probiotic Product (PROBIOTIC PO) Take 1 capsule by mouth daily.  Marland Kitchen Propylene Glycol (SYSTANE BALANCE OP) Place 2 drops into both eyes daily as needed (dry eyes).  . rosuvastatin (CRESTOR) 10 MG tablet Take 1 tablet (10 mg total) by mouth daily.  . sertraline (ZOLOFT) 100 MG tablet Take 100 mg by mouth daily.   Marland Kitchen telmisartan (MICARDIS) 40 MG tablet Take 40 mg by mouth daily.   No facility-administered encounter medications on file as of 07/05/2020.    Review of Systems  Constitutional: Negative for chills, fever and malaise/fatigue.  HENT: Negative for congestion and sore throat.        Thrush resolved  Eyes: Negative for blurred vision.  Respiratory: Negative for cough and shortness of breath.    Cardiovascular: Negative for chest pain and palpitations.       No swelling now, but reports she still gets  some when not elevating feet  Gastrointestinal: Positive for diarrhea. Negative for abdominal pain, blood in stool, constipation and melena.  Genitourinary: Negative for dysuria.       Some incontinence  Musculoskeletal: Negative for falls and joint pain.       All resolved (baseline only joint pain is left knee with OA)  Skin: Negative for itching and rash.  Neurological: Positive for weakness. Negative for dizziness and loss of consciousness.  Endo/Heme/Allergies: Bruises/bleeds easily.  Psychiatric/Behavioral: Negative for depression and memory loss. The patient is not nervous/anxious and does not have insomnia.     Vitals:   07/05/20 1540  BP: 122/74  Pulse: 74  Temp: 98 F (36.7 C)  Weight: 170 lb (77.1 kg)  Height: 5\' 3"  (1.6 m)   Body mass index is 30.11 kg/m. Physical Exam Constitutional:      General: She is not in acute distress.    Appearance: Normal appearance. She is not toxic-appearing.     Comments: Some findings of chronic prednisone therapy  HENT:     Head: Normocephalic and atraumatic.     Right Ear: External ear normal.     Left Ear: External ear normal.     Nose: Nose normal.     Mouth/Throat:     Pharynx: Oropharynx is clear. No oropharyngeal exudate.  Eyes:     Conjunctiva/sclera: Conjunctivae normal.     Pupils: Pupils are equal, round, and reactive to light.  Cardiovascular:     Rate and Rhythm: Normal rate and regular rhythm.     Pulses: Normal pulses.  Pulmonary:     Effort: Pulmonary effort is normal.     Breath sounds: Normal breath sounds. No wheezing, rhonchi or rales.  Abdominal:     General: Bowel sounds are normal. There is no distension.     Palpations: Abdomen is soft.     Tenderness: There is no abdominal tenderness. There is no guarding or rebound.  Musculoskeletal:        General: Normal range of motion.     Cervical  back: Neck supple.     Right lower leg: No edema.     Left lower leg: No edema.     Comments: No tenderness of right wrist or arm and full ROM of joints; no rash  Lymphadenopathy:     Cervical: No cervical adenopathy.  Skin:    Capillary Refill: Capillary refill takes less than 2 seconds.     Findings: Bruising present.  Neurological:     General: No focal deficit present.     Mental Status: She is alert and oriented to person, place, and time.     Cranial Nerves: No cranial nerve deficit.     Sensory: No sensory deficit.     Motor: Weakness present.     Coordination: Coordination normal.     Gait: Gait abnormal.     Deep Tendon Reflexes: Reflexes normal.     Comments: A bit weak but able to reposition herself from sitting on side of bed to lying in bed and ambulating with walker  Psychiatric:        Mood and Affect: Mood normal.        Behavior: Behavior normal.        Thought Content: Thought content normal.        Judgment: Judgment normal.     Labs reviewed: Basic Metabolic Panel: Recent Labs    06/25/20 1208 06/26/20 [id number] 06/26/20 0957 06/27/20 [id number] 06/29/20 0247 06/30/20 [id number]  NA  --    < >  --  138 141 140  K  --    < >  --  3.7 3.5 3.5  CL  --    < >  --  107 111 107  CO2  --    < >  --  23 23 26   GLUCOSE  --    < >  --  156* 153* 150*  BUN  --    < >  --  18 16 14   CREATININE  --    < >  --  0.80 0.68 0.73  CALCIUM  --    < >  --  8.2* 8.3* 8.6*  MG 2.0  --  1.7  --   --  2.2   < > = values in this interval not displayed.   Liver Function Tests: Recent Labs    01/25/20 1704 06/25/20 0510 06/26/20 0516  AST 21 20 20   ALT 22 32 28  ALKPHOS 50 47 44  BILITOT 0.8 0.7 1.6*  PROT 6.8 5.9* 5.7*  ALBUMIN 4.1 3.5 3.1*   No results for input(s): LIPASE, AMYLASE in the last 8760 hours. No results for input(s): AMMONIA in the last 8760 hours. CBC: Recent Labs    01/25/20 1704 06/25/20 0510 06/26/20 0516 06/29/20 0247 06/30/20 0312  WBC 9.2 9.3 7.1  4.0 4.7  NEUTROABS 6.9 6.7  --   --  3.2  HGB 14.5 14.3 13.9 11.4* 11.9*  HCT 45.6 41.9 42.3 36.0 36.4  MCV 97.6 96.8 99.5 101.7* 98.6  PLT 225 118* PLATELET CLUMPS NOTED ON SMEAR, UNABLE TO ESTIMATE 113* 115*   Cardiac Enzymes: Recent Labs    06/26/20 0957  CKTOTAL 39   BNP: Invalid input(s): POCBNP No results found for: HGBA1C No results found for: TSH No results found for: VITAMINB12 No results found for: FOLATE No results found for: IRON, TIBC, FERRITIN  Imaging and Procedures obtained prior to SNF admission: DG Shoulder 1V Right  Result Date: 06/25/2020 CLINICAL DATA:  Right shoulder pain EXAM: RIGHT SHOULDER - 1 VIEW COMPARISON:  01/31/2017 FINDINGS: There is no evidence of fracture or dislocation. Mild degenerative changes of the glenohumeral and acromioclavicular joints. Remote healed fracture of the right seventh rib. Soft tissues are unremarkable. IMPRESSION: Negative. Electronically Signed   By: 06/28/20 D.O.   On: 06/25/2020 12:04   DG Wrist 2 Views Left  Result Date: 06/25/2020 CLINICAL DATA:  Bilateral wrist pain without trauma. EXAM: LEFT WRIST - 2 VIEW COMPARISON:  None. FINDINGS: AP and lateral views. No acute fracture or dislocation. Osteopenia. Chondrocalcinosis in the triangular fibrocartilage. Degenerative changes including joint space narrowing and subchondral sclerosis involving the radiocarpal articulation and the radial aspect of the carpal bones. 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 Electronically Signed   By: Duanne Guess M.D.   On: 06/25/2020 12:05   DG Wrist 2 Views Right  Result Date: 06/25/2020 CLINICAL DATA:  Right wrist pain EXAM: RIGHT WRIST - 2 VIEW COMPARISON:  None. FINDINGS: Subtle cortical irregularity of the distal scaphoid pole  along its radial cortex, likely degenerative spurring. Nondisplaced fracture not excluded on the included projections. Borderline widening of the scapholunate interval. Degenerative changes at the first [facility] and triscaphe joints status post trapeziectomy. Radiocarpal joint space narrowing. Chondrocalcinosis of the TFC. Mild soft tissue swelling. IMPRESSION: 1. Subtle cortical irregularity of  the distal scaphoid pole along its radial cortex, likely degenerative spurring. Nondisplaced fracture not excluded on the included projections. Correlate with point tenderness. 2. Otherwise, no acute osseous findings. 3. Scapholunate interval is upper limits of normal which may reflect underlying ligamentous degeneration or tearing. Electronically Signed   By: Duanne Guess D.O.   On: 06/25/2020 12:08   DG Chest Port 1 View  Result Date: 06/25/2020 CLINICAL DATA:  Sepsis. EXAM: PORTABLE CHEST 1 VIEW COMPARISON:  04/28/2020 FINDINGS: 0512 hours. Cardiopericardial silhouette is at upper limits of normal for size. The lungs are clear without focal pneumonia, edema, pneumothorax or pleural effusion. Interstitial markings are diffusely coarsened with chronic features. Nonacute rib fractures noted bilaterally. Telemetry leads overlie the chest. IMPRESSION: Chronic interstitial coarsening without acute cardiopulmonary findings. Electronically Signed   By: Kennith Center M.D.   On: 06/25/2020 06:02    Assessment/Plan 1. Crohn's disease without complication, unspecified gastrointestinal tract location [facility]) -has been getting infliximab but may now have had a delayed reaction to it in the form of a drug-induced lupus vs she has pseudogout and adrenal insufficiency in combination  -f/u with Dr. Conley Rolls to determine future treatment -also continue prednisone 10mg  for 6 more days, the 5mg  thereafter awaiting further guidance from GI  2. 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 Crohn's disease with complication, unspecified gastrointestinal tract location (HCC) -continue prednisone taper and f/u with GI  6. Oral candidiasis -resolved with diflucan therapy at hospital, uses nystatin at home, but has apparently had some degree of yeast longstanding on her tongue per pt and husband,  7. Polyarthralgia -suspect due to drug-induced lupus picture -resolved  8. Generalized weakness -better after prednisone resumed and other symptoms improved -possibly had some adrenal insufficiency also -continue PT at home with [facility]  9. Primary hypertension -bp at goal with current regimen based on vitals here at SNF--cont home telmisartan, atenolol  10. Gastroesophageal reflux disease without esophagitis -cont protonix bid before meals  Family/ staff Communication: d/w pt, her husband, Jim, and SNF nurse, records reviewed  Labs/tests ordered:  Will need f/u BMP as pt reports some decrease in amount of urine output (admits may be drinking a little less here though)  Trayshawn Durkin L. Jeani Fassnacht, D.O. [facility] [address], [address] [place] Cell Phone (Mon-Fri 8am-5pm):  [phone] On Call:  [phone] & follow prompts after 5pm & weekends Office Phone:  [phone] Office Fax:  [phone]

## 2020-07-11 DIAGNOSIS — K5090 Crohn's disease, unspecified, without complications: Secondary | ICD-10-CM

## 2020-07-11 DIAGNOSIS — I10 Essential (primary) hypertension: Secondary | ICD-10-CM

## 2020-07-11 DIAGNOSIS — E785 Hyperlipidemia, unspecified: Secondary | ICD-10-CM

## 2020-07-11 DIAGNOSIS — R32 Unspecified urinary incontinence: Secondary | ICD-10-CM

## 2020-07-11 DIAGNOSIS — M810 Age-related osteoporosis without current pathological fracture: Secondary | ICD-10-CM

## 2020-07-11 DIAGNOSIS — K5790 Diverticulosis of intestine, part unspecified, without perforation or abscess without bleeding: Secondary | ICD-10-CM

## 2020-07-11 DIAGNOSIS — K219 Gastro-esophageal reflux disease without esophagitis: Secondary | ICD-10-CM

## 2020-07-11 DIAGNOSIS — R1310 Dysphagia, unspecified: Secondary | ICD-10-CM

## 2020-07-28 ENCOUNTER — Encounter: Payer: Self-pay | Admitting: Orthopedic Surgery

## 2020-07-28 NOTE — Progress Notes (Signed)
Location: [facility] Provider: Hazle Nordmann, AGNP-C     Tiffany L. Renato Gails, D.O., C.M.D.   Goals of Care:  Advanced Directives 06/25/2020  Does Patient Have a Medical Advance Directive? Yes  Type of Advance Directive Living will  Does patient want to make changes to medical advance directive? No - Patient declined  Would patient like information on creating a medical advance directive? -     No chief complaint on file.   HPI: Patient is a 75 y.o. female seen today for hospital follow-up s/p admission from [facility] 12/25- 12/30. PMH includes: Chron's, GERD, hyperlipidemia, hypertension, and osteoporosis.   Symptoms of bilateral wrist pain, right shoulder pain, and sore throat began 12/24. She presented to [facility] ED 12/25 when she became increasingly weak and had trouble ambulating to bathroom. In the ED, she had a fever of 100.6. UA unremarkable. CXR unremarkable. Covid negative. Vancomycin and cefepime started, procal 0.10. In addition, she had a rash on the palms of her hands, joint pain at multiple sites and thrombocytopenia. She was thought to possibly have RMSF, but she denied any exposure, doxy added to regimen. She had a history of taking steroids, reported she recently stopped a taper following Infliximab. Concerned her symptoms may have been caused to taking Infliximab a second time. Solumedrol 40 mg given daily. Her sore throat was suspected to be related to Chron's and GI recommended nystatin, Carafate and Protonix.   Her polyarthralgia was r/o as infectious by orthopedics. CRP elevated. MRI wrist negative for fracture but showed small cystic degenerative changes within the carpals, mild extensor compartment and extensor carpi ulnar wrist T no synovitis, small radial ulnar joint effusion. Prednisone 10 mg bid started, then reduced to daily. Recommended continuing prednisone 10 mg daily until f/u with GI and orthopod. RMSF IgM negative. Blood  cultures negative. Doxy was discontinued. Oral thrush treated with nystatin and Carafate. She was discharged with 2 days diflucan, oral nystatin and 3 more days Carafate. Oxygen weaned off. SNF was recommended for additional PT/OT and skilled nursing services.   Today, she is alert and oriented x 4. Follows commands and can express needs. She is ambulating with a front wheeled walker to bathroom on her own. Ambulating 150 ft with supervision. She denies any wrist, shoulder or throat pain at this time. Upset she has gained weight while taking prednisone. Hopes to be off Prednisone in the future, but understands her biologic to treat Chron's may not be a possibility. She hopes to go home tomorrow. She is worried with her immunocompromised state, she is susceptible to catch something.   MOST form discussed with patient during encounter. At this time she does not want to fill it out. States she is leaving in 1-2 days, does not see the importance of filling it out. Advanced directive is at home. Husband has been advised to bring copy to facility.      Past Medical History:  Diagnosis Date  . Crohn disease (HCC)   . Diverticulosis   . Dyslipidemia   . GERD (gastroesophageal reflux disease)   . Hypertension   . Osteoporosis     Past Surgical History:  Procedure Laterality Date  . HAND SURGERY Right   . NASAL SINUS SURGERY    . REPLACEMENT TOTAL KNEE Right     Allergies  Allergen Reactions  . Azathioprine     Other reaction(s): Fever (intolerance)  . Levofloxacin Other (See Comments)    Unknown  . Nitrofuran  Derivatives Other (See Comments)    Unknown  . Omeprazole Diarrhea  . Phenazopyridine Other (See Comments)    Unknown  . Decongest-Aid [Pseudoephedrine] Palpitations  . Loratadine Palpitations    Allergy to anithistamines  . Other Palpitations and Other (See Comments)    Antihistamine; Reactions: Tachycardia Decongestant; Reactions: Tachycardia    Outpatient Encounter  Medications as of 07/04/2020  Medication Sig  . Ascorbic Acid (VITAMIN C PO) Take 1-3 tablets by mouth See admin instructions. Takes 3 tablets in the morning and1 tablet at night  . aspirin 81 MG tablet Take 81 mg by mouth daily.  Marland Kitchen atenolol (TENORMIN) 50 MG tablet Take 50 mg by mouth daily.  . calcium-vitamin D (OSCAL WITH D) 500-200 MG-UNIT tablet Take 1 tablet by mouth daily.   . Cholecalciferol (VITAMIN D PO) Take 2 capsules by mouth daily.  . colchicine 0.6 MG tablet Take 1 tablet (0.6 mg total) by mouth 2 (two) times daily.  Marland Kitchen CRANBERRY PO Take 2 capsules by mouth 2 (two) times daily.   Marland Kitchen ELDERBERRY PO Take 1-3 capsules by mouth See admin instructions. Takes 3 tablets in the morning and 1 at night  . estradiol (ESTRACE) 0.1 MG/GM vaginal cream Place 1 Applicatorful vaginally at bedtime.  . furosemide (LASIX) 20 MG tablet Take 20 mg by mouth daily.  Marland Kitchen inFLIXimab-dyyb (INFLECTRA IV) Inject 1 each into the vein every 14 (fourteen) days.  . Multiple Vitamins-Minerals (MULTIVITAMIN WITH MINERALS) tablet Take 1 tablet by mouth daily.  . ondansetron (ZOFRAN ODT) 4 MG disintegrating tablet Take 1 tablet (4 mg total) by mouth every 4 (four) hours as needed for nausea or vomiting. (Patient not taking: No sig reported)  . ondansetron (ZOFRAN-ODT) 4 MG disintegrating tablet Take 2 tablets (8 mg total) by mouth every 8 (eight) hours as needed for nausea or vomiting. (Patient not taking: No sig reported)  . oxyCODONE (OXY IR/ROXICODONE) 5 MG immediate release tablet Take 1 tablet (5 mg total) by mouth every 4 (four) hours as needed for moderate pain.  . pantoprazole (PROTONIX) 40 MG tablet Take 40 mg by mouth 2 (two) times daily.  . predniSONE (DELTASONE) 10 MG tablet Take 1 tablet (10 mg total) by mouth 2 (two) times daily with a meal. Take 1 tablet 2 times daily x 1 week, then 10mg  daily until seen by GI.  Probiotic Product (PROBIOTIC PO) Take 1 capsule by mouth daily.  Marland Kitchen Propylene Glycol (SYSTANE  BALANCE OP) Place 2 drops into both eyes daily as needed (dry eyes).  Marland Kitchen ON 07/07/2020] rosuvastatin (CRESTOR) 10 MG tablet Take 1 tablet (10 mg total) by mouth daily.  . sertraline (ZOLOFT) 100 MG tablet Take 100 mg by mouth daily.   . sucralfate (CARAFATE) 1 GM/10ML suspension Take 10 mLs (1 g total) by mouth 4 (four) times daily -  with meals and at bedtime for 3 days.  09/04/2020 telmisartan (MICARDIS) 40 MG tablet Take 40 mg by mouth daily.   No facility-administered encounter medications on file as of 07/04/2020.    Review of Systems:  Review of Systems  Constitutional: Negative for fever, malaise/fatigue and weight loss.       Weight gain due to steroids  HENT: Negative for hearing loss and sore throat.   Eyes: Negative for photophobia.  Respiratory: Negative for cough, shortness of breath and wheezing.   Cardiovascular: Negative for chest pain and leg swelling.  Gastrointestinal: Negative for abdominal pain, constipation, heartburn and vomiting.  Chron's  Genitourinary: Negative for dysuria and hematuria.  Musculoskeletal: Positive for joint pain and myalgias. Negative for falls.  Neurological: Positive for weakness. Negative for dizziness and headaches.  Psychiatric/Behavioral: Negative for depression. The patient is not nervous/anxious and does not have insomnia.     Health Maintenance  Topic Date Due  . TETANUS/TDAP  Never done  . COLONOSCOPY (Pts 45-73yrs Insurance coverage will need to be confirmed)  Never done  . DEXA SCAN  Never done  . INFLUENZA VACCINE  Completed  . COVID-19 Vaccine  Completed  . Hepatitis C Screening  Completed  . PNA vac Low Risk Adult  Completed    Physical Exam: There were no vitals filed for this visit. There is no height or weight on file to calculate BMI. Physical Exam Vitals reviewed.  Constitutional:      General: She is not in acute distress.    Appearance: Normal appearance.  HENT:     Head: Normocephalic and atraumatic.      Mouth/Throat:     Mouth: Mucous membranes are moist.     Pharynx: No posterior oropharyngeal erythema.  Eyes:     Conjunctiva/sclera: Conjunctivae normal.     Pupils: Pupils are equal, round, and reactive to light.  Cardiovascular:     Rate and Rhythm: Normal rate and regular rhythm.     Pulses: Normal pulses.     Heart sounds: Normal heart sounds.  Pulmonary:     Effort: Pulmonary effort is normal.     Breath sounds: Normal breath sounds.  Abdominal:     General: Bowel sounds are normal. There is no distension.     Palpations: Abdomen is soft.     Tenderness: There is no abdominal tenderness.  Musculoskeletal:        General: No tenderness. Normal range of motion.     Comments: Right and left wrist with no swelling, FROM, no pain or tenderness throughout exam. Right shoulder with no effusion, FROM, no pain or tenderness noted.   Skin:    General: Skin is warm and dry.     Capillary Refill: Capillary refill takes less than 2 seconds.     Comments: Left buttock with nickel sized excoriated area. Skin dry and flaking, no drainage. Cream applied and covered with foam dressing.   Neurological:     Mental Status: She is alert and oriented to person, place, and time.     Motor: Weakness present.     Gait: Gait abnormal.  Psychiatric:        Mood and Affect: Mood normal.        Behavior: Behavior normal.     Labs reviewed: Basic Metabolic Panel: Recent Labs    06/25/20 1208 06/26/20 0516 06/26/20 0957 06/27/20 0433 06/29/20 0247 06/30/20 0312  NA  --    < >  --  138 141 140  K  --    < >  --  3.7 3.5 3.5  CL  --    < >  --  107 111 107  CO2  --    < >  --  23 23 26   GLUCOSE  --    < >  --  156* 153* 150*  BUN  --    < >  --  18 16 14   CREATININE  --    < >  --  0.80 0.68 0.73  CALCIUM  --    < >  --  8.2* 8.3* 8.6*  MG 2.0  --  1.7  --   --  2.2   < > = values in this interval not displayed.   Liver Function Tests: Recent Labs    01/25/20 1704 06/25/20 0510  06/26/20 0516  AST 21 20 20   ALT 22 32 28  ALKPHOS 50 47 44  BILITOT 0.8 0.7 1.6*  PROT 6.8 5.9* 5.7*  ALBUMIN 4.1 3.5 3.1*   No results for input(s): LIPASE, AMYLASE in the last 8760 hours. No results for input(s): AMMONIA in the last 8760 hours. CBC: Recent Labs    01/25/20 1704 06/25/20 0510 06/26/20 0516 06/29/20 0247 06/30/20 0312  WBC 9.2 9.3 7.1 4.0 4.7  NEUTROABS 6.9 6.7  --   --  3.2  HGB 14.5 14.3 13.9 11.4* 11.9*  HCT 45.6 41.9 42.3 36.0 36.4  MCV 97.6 96.8 99.5 101.7* 98.6  PLT 225 118* PLATELET CLUMPS NOTED ON SMEAR, UNABLE TO ESTIMATE 113* 115*   Lipid Panel: No results for input(s): CHOL, HDL, LDLCALC, TRIG, CHOLHDL, LDLDIRECT in the last 8760 hours. No results found for: HGBA1C  Procedures since last visit: DG Shoulder 1V Right  Result Date: 06/25/2020 CLINICAL DATA:  Right shoulder pain EXAM: RIGHT SHOULDER - 1 VIEW COMPARISON:  01/31/2017 FINDINGS: There is no evidence of fracture or dislocation. Mild degenerative changes of the glenohumeral and acromioclavicular joints. Remote healed fracture of the right seventh rib. Soft tissues are unremarkable. IMPRESSION: Negative. Electronically Signed   By: 04/02/2017 D.O.   On: 06/25/2020 12:04   DG Wrist 2 Views Left  Result Date: 06/25/2020 CLINICAL DATA:  Bilateral wrist pain without trauma. EXAM: LEFT WRIST - 2 VIEW COMPARISON:  None. FINDINGS: AP and lateral views. No acute fracture or dislocation. Osteopenia. Chondrocalcinosis in the triangular fibrocartilage. Degenerative changes including joint space narrowing and subchondral sclerosis involving the radiocarpal articulation and the radial aspect of the carpal bones. 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 Electronically Signed   By: 06/27/2020 M.D.   On:  06/25/2020 12:05   DG Wrist 2 Views Right  Result Date: 06/25/2020 CLINICAL DATA:  Right wrist pain EXAM: RIGHT WRIST - 2 VIEW COMPARISON:  None. FINDINGS: Subtle cortical irregularity of the distal scaphoid pole along its radial cortex, likely degenerative spurring. Nondisplaced fracture not excluded on the included projections. Borderline widening of the scapholunate interval. Degenerative changes at the first [facility] and triscaphe joints status post trapeziectomy. Radiocarpal joint space narrowing. Chondrocalcinosis of the TFC. Mild soft tissue swelling. IMPRESSION: 1. Subtle cortical irregularity of the distal scaphoid pole along its radial cortex, likely degenerative spurring. Nondisplaced fracture not excluded on the included projections. Correlate with point tenderness. 2. Otherwise, no acute osseous findings. 3. Scapholunate interval is upper limits of normal which may reflect underlying ligamentous degeneration or tearing. Electronically Signed   By: [facility] D.O.   On: 06/25/2020 12:08   DG Chest Port 1 View  Result Date: 06/25/2020 CLINICAL DATA:  Sepsis. EXAM: PORTABLE CHEST 1 VIEW COMPARISON:  04/28/2020 FINDINGS: 0512 hours. Cardiopericardial silhouette is at upper limits of normal for size. The lungs are clear without focal pneumonia, edema, pneumothorax or pleural effusion. Interstitial markings are diffusely coarsened with chronic features. Nonacute rib fractures noted bilaterally. Telemetry leads overlie the chest. IMPRESSION: Chronic interstitial coarsening without acute cardiopulmonary findings. Electronically Signed   By: 04/30/2020 M.D.   On: 06/25/2020 06:02    Assessment/Plan: 1.  Generalized weakness - improved with PT/OT - ambulating 150 ft with FWW - continue PT/OT sessions  2. Gastroesophageal reflux disease without esophagitis - stable, continue Protonix 40 mg po bid  3. Crohn's disease with complication, unspecified gastrointestinal tract location [facility]) -  followed by Dr. Truman Hayward - receiving inflectra injections every 14 days for Chron's - advised to continue prednisone 10 mg daily until f/u - she denies flare up at this time - left buttocks with excoriated area due to recent flare, continue to apply antibiotic ointment and cover with foam dressing  4. Polyarthralgia - stable with prednisone regimen - ortho r/u infectious - she remains afebrile, with no swelling observed at wrists or right shoulder - continue f/u with orthopedist  5. Oral candidiasis - suspect sore throat caused by candida - followed by GI, continue nystatin and Carafate - continue diflucan 100 mg x 2 days- complete 12/31  6. Dysphagia, unspecified type -improved, she can swallow without difficulty - ST recommends regular diet with thin liquids  7. Primary hypertension - bp at goal < 150/90 - continue lasix, micardis, and atenolol - cbc/diff in 1 week - bmp in 1 week    Labs/tests ordered: cbc/diff, bmp  Next appt:  Visit date not found  Windell Moulding, AGNP-C [facility] [address], [place] [address] On Call:  [phone] & follow prompts after 5pm & weekends Office Phone:  [phone] Office Fax:  [phone]

## 2020-08-01 ENCOUNTER — Encounter: Payer: Self-pay | Admitting: Orthopedic Surgery

## 2020-08-08 NOTE — Progress Notes (Signed)
This encounter was created in error - please disregard.

## 2020-08-08 NOTE — Addendum Note (Signed)
Addended by: Karlene Lineman on: 08/08/2020 04:26 PM   Modules accepted: Level of Service, SmartSet

## 2022-01-04 IMAGING — MR MR WRIST*R* W/O CM
5 series · 39 of 40 positions shown · non-contrast
Comparison: None.

CLINICAL DATA: Wrist pain

EXAM:
MR OF THE RIGHT WRIST WITHOUT CONTRAST
TECHNIQUE: Multiplanar, multisequence MR imaging of the right wrist was
performed. No intravenous contrast was administered.

[Series 11: T2 fat-sat · axial · right · 4.0mm · 0.47mm/px · z∈[-85,+39]mm · 10 of 28 slices shown (1 of 2)]
[im 1/28]
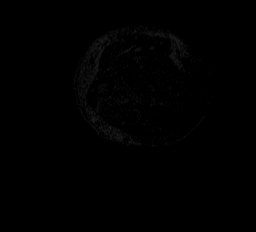
[im 4/28]
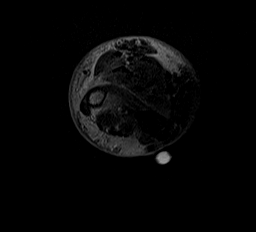
[im 7/28]
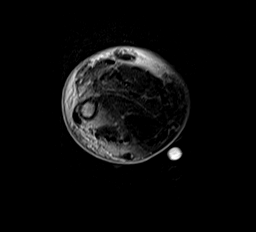
[im 10/28]
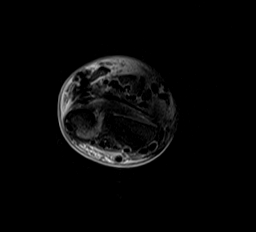
[im 13/28]
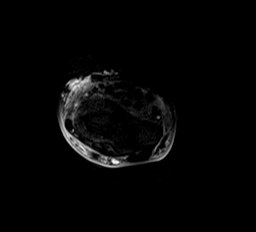
[im 16/28]
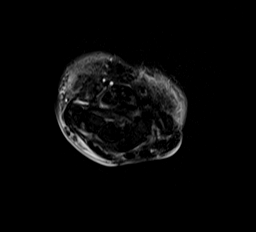
[im 19/28]
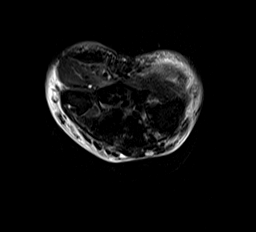
[im 22/28]
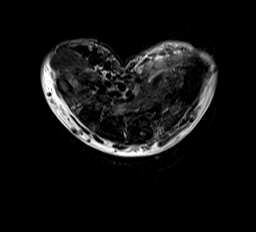
[im 25/28]
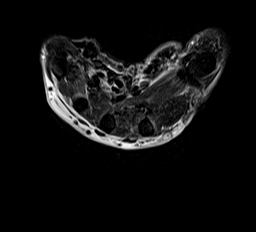
[im 28/28]
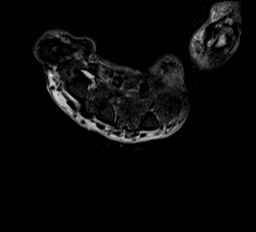

[Series 12: T1 · axial · right · 4.0mm · 0.25mm/px · z∈[-85,+40]mm · 9 of 28 slices shown (1 of 2)]
[im 1/28]
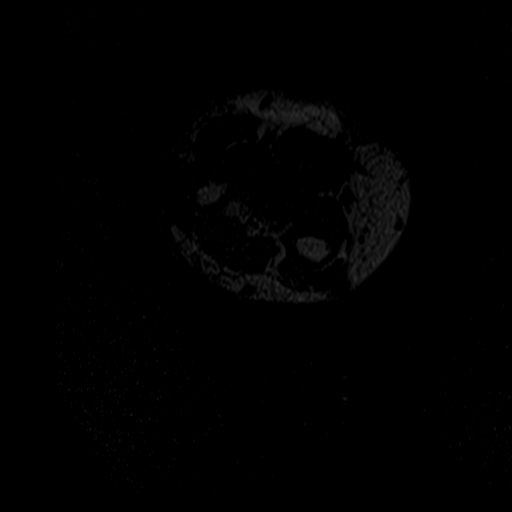
[im 4/28]
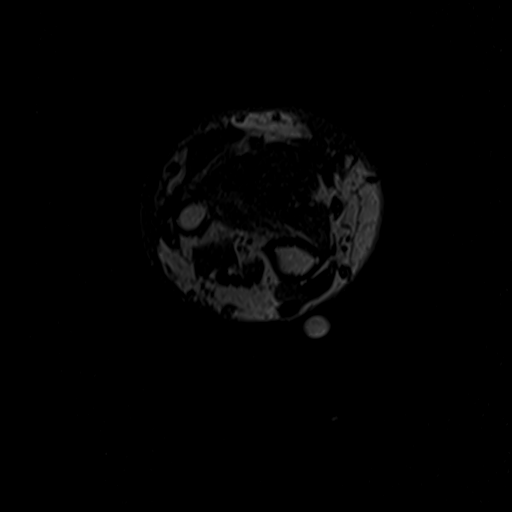
[im 7/28]
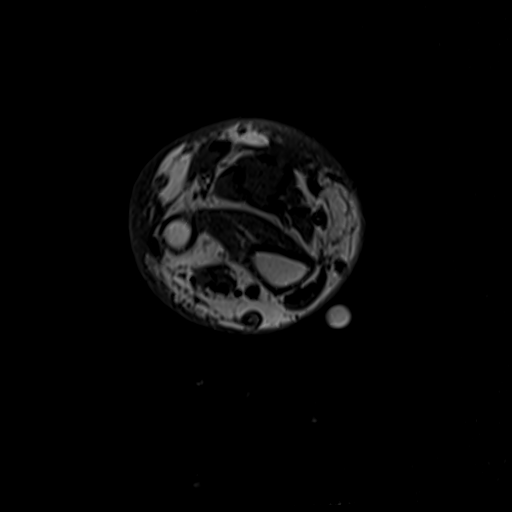
[im 10/28]
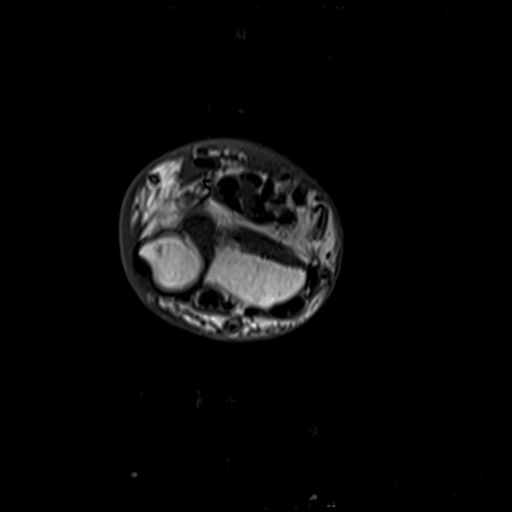
[im 13/28]
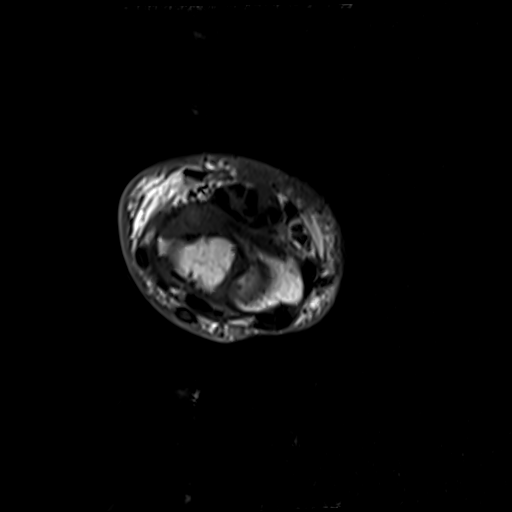
[im 16/28]
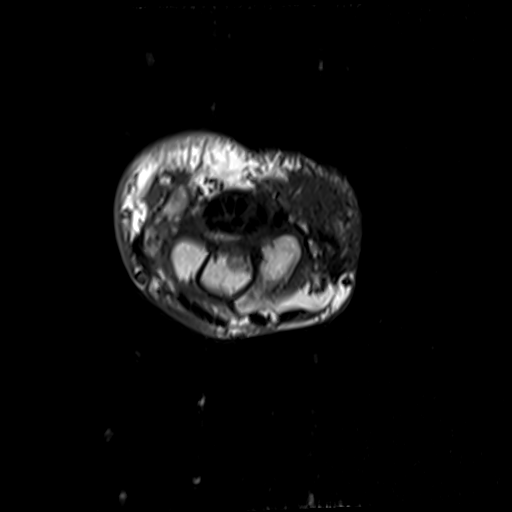
[im 19/28]
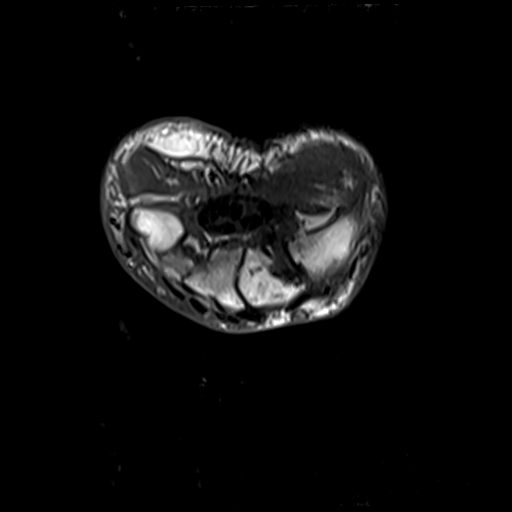
[im 25/28]
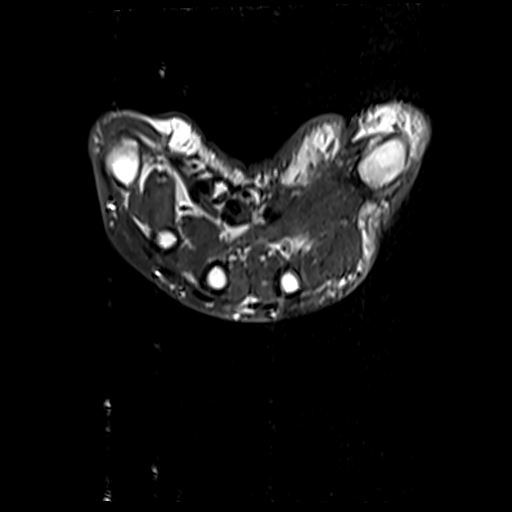
[im 28/28]
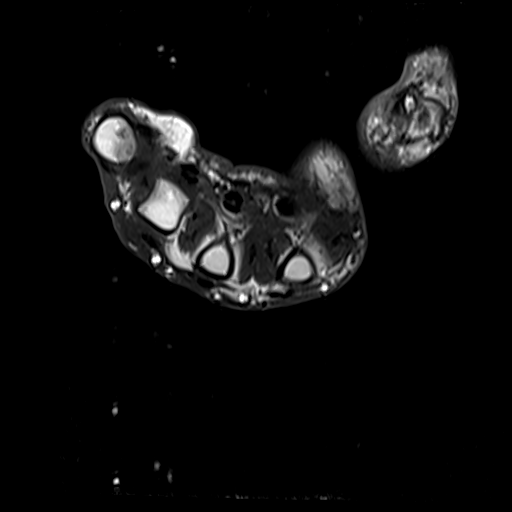

[Series 13: T1 · oblique · right · 3.0mm · 0.53mm/px · 6 of 19 slices shown (2 of 2)]
[im 1/19]
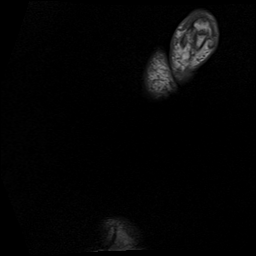
[im 4/19]
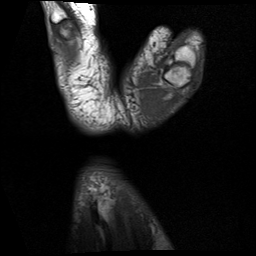
[im 8/19]
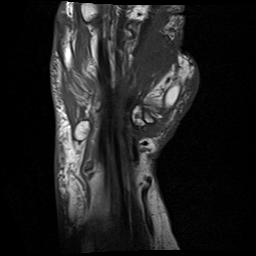
[im 11/19]
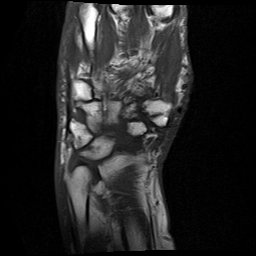
[im 15/19]
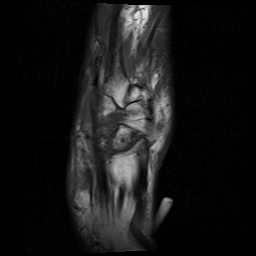
[im 19/19]
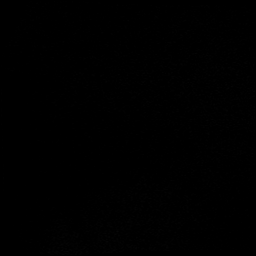

[Series 14: T2 fat-sat · oblique · right · 3.0mm · 0.47mm/px · 6 of 19 slices shown (2 of 2)]
[im 1/19]
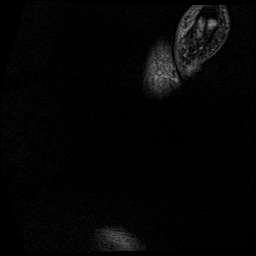
[im 4/19]
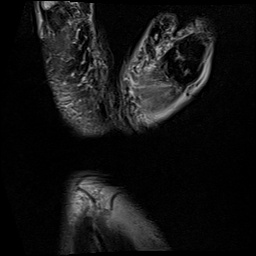
[im 8/19]
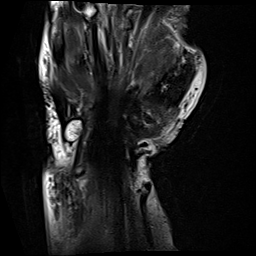
[im 11/19]
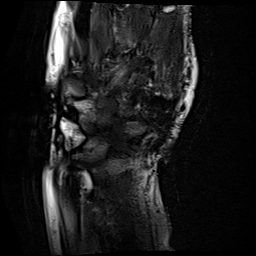
[im 15/19]
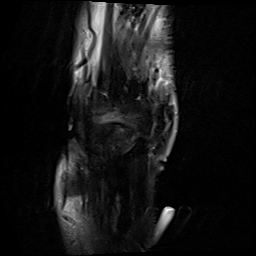
[im 19/19]
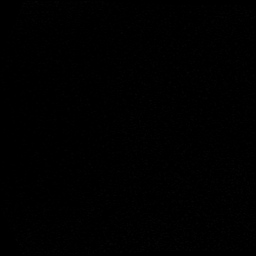

[Series 15: PD fat-sat · sagittal · right · 3.0mm · 0.44mm/px · 8 of 23 slices shown]
[im 1/23]
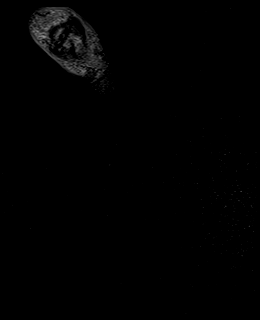
[im 4/23]
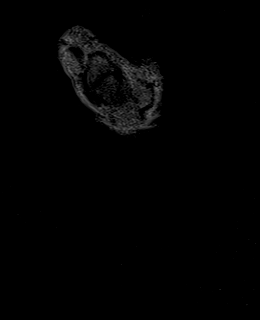
[im 7/23]
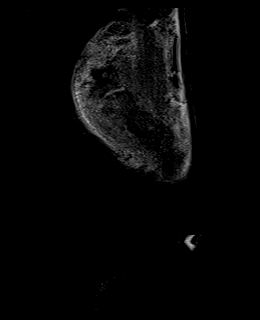
[im 10/23]
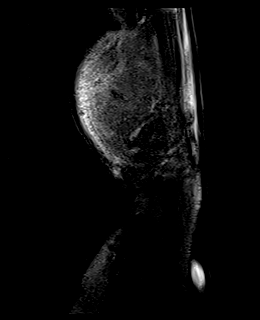
[im 13/23]
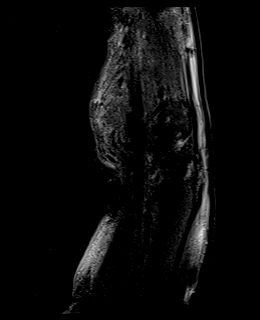
[im 16/23]
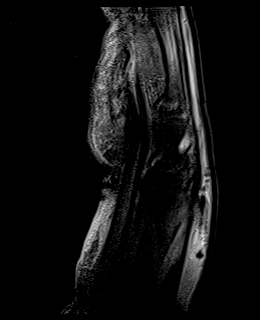
[im 19/23]
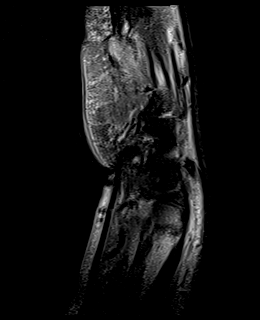
[im 23/23]
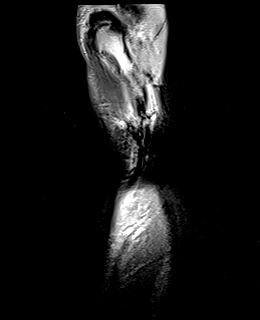

[39 of 40 positions shown; findings below may reference images not displayed]

FINDINGS: The study is somewhat limited due to technique.

Alignment
Ulnar variance:  None

Distal radioulnar joint Congruent.

Fluid
A small distal radioulnar joint effusion is seen.

Intrinsic ligaments
Scapholunate ligament:  Intact.

Lunotriquetral ligament:  Intact.

Triangular fibrocartilage: I the TFCC appears to be attenuated,
however grossly intact.

Other: Normal

Extensor compartment
A small amount of fluid is seen surrounding extensor compartment 1
and 2 at the level of the proximal carpal row. There is also a small
amount of fluid seen surrounding extensor carpi ulnaris tendon.

Flexor compartment
Carpal tunnel:  Normal.

Median nerve:  Normal.

Flexor retinaculum:  Normal.

Flexor tendons:  Normal in position, signal,and configuration.

[REDACTED]:  Normal.

Articulations
Thumb carpometacarpal joint:  Normal.

Pisiform-triquetral joint:  Normal.

Lunate facets: Type 2

Other findings
Bones: The patient has had a prior trapezii ectomy. Small cystic
changes are seen throughout the carpals. However no bony erosive
type change is seen. No areas of periosteal reaction or cortical
destruction.

Muscles:  Normal.

Vessels:  Normal.

Soft Tissues: Dorsal subcutaneous edema is seen throughout the
wrist. No focal soft tissue mass or fluid collection.
IMPRESSION: Small cystic degenerative changes are seen throughout the carpals.
The patient is status post trapeziectomy. No acute osseous
abnormality.

Mild extensor compartment 1, 2, and extensor carpi ulnaris
tenosynovitis

Small radioulnar joint effusion

Degenerative changes of the TFCC, however does appear to be intact.
